# Patient Record
Sex: Female | Born: 1940 | Race: White | Hispanic: No | Marital: Married | Smoking: Current every day smoker
Health system: Southern US, Community
[De-identification: ages and names within clinical notes are randomized; demographics above are authoritative.]

## PROBLEM LIST (undated history)

## (undated) DIAGNOSIS — M549 Dorsalgia, unspecified: Secondary | ICD-10-CM

## (undated) DIAGNOSIS — F32A Depression, unspecified: Secondary | ICD-10-CM

## (undated) DIAGNOSIS — G8929 Other chronic pain: Secondary | ICD-10-CM

## (undated) DIAGNOSIS — R0602 Shortness of breath: Secondary | ICD-10-CM

## (undated) DIAGNOSIS — F329 Major depressive disorder, single episode, unspecified: Secondary | ICD-10-CM

## (undated) DIAGNOSIS — F419 Anxiety disorder, unspecified: Secondary | ICD-10-CM

## (undated) HISTORY — PX: APPENDECTOMY: SHX54

## (undated) HISTORY — PX: FINGER EXPLORATION: SHX1635

---

## 2009-12-11 ENCOUNTER — Ambulatory Visit: Payer: Self-pay | Admitting: Orthopedic Surgery

## 2009-12-11 DIAGNOSIS — M1710 Unilateral primary osteoarthritis, unspecified knee: Secondary | ICD-10-CM

## 2010-01-08 ENCOUNTER — Ambulatory Visit: Payer: Self-pay | Admitting: Orthopedic Surgery

## 2010-01-08 DIAGNOSIS — G589 Mononeuropathy, unspecified: Secondary | ICD-10-CM | POA: Insufficient documentation

## 2010-01-16 ENCOUNTER — Encounter: Payer: Self-pay | Admitting: Orthopedic Surgery

## 2010-05-29 NOTE — Assessment & Plan Note (Signed)
Summary: NEW PROB/LT ANKLE PAIN/?XRAY/SEC HORIZ/CAF   Vital Signs:  Patient profile:   70 year old female Height:      64 inches Weight:      133 pounds Pulse rate:   74 / minute Resp:     16 per minute  Vitals Entered By: Fuller Canada MD (January 08, 2010 1:40 PM)  Visit Type:  new problem Referring Provider:  Dr. Gerilyn Pilgrim Primary Provider:  Dr. Gerilyn Pilgrim  CC:  left ankle pain.  History of Present Illness: I saw Christina Barnett in the office today for a  visit.  She is a 70 years old woman with the complaint of:  left ankle pain.  No injury.  Xrays today.  Meds: Endocet 10/325, Neurotnin 300mg , Cymbalta 60mg , Xanax 1mg , Nortriptyline 50mg .  70 year old female with sharp throbbing constant pain in her LEFT ankle and foot which she rates an 8/10.  Pain came on suddenly associated with intermittent swelling but no injury.  Pain is worse at night and goes into the lateral portion of her foot.  Pain seems to be better after walking worse with sitting or laying down.    Allergies: 1)  ! Penicillin 2)  ! Sulfa  Past History:  Past Surgical History: rt foot right hand  Family History: Family History of Diabetes Family History Coronary Heart Disease female < 71 Hx, family, chronic respiratory condition Family History of Arthritis  Review of Systems Constitutional:  Denies weight loss, weight gain, fever, chills, and fatigue. Cardiovascular:  Denies chest pain, palpitations, fainting, and murmurs. Respiratory:  Denies short of breath, wheezing, couch, tightness, pain on inspiration, and snoring . Gastrointestinal:  Denies heartburn, nausea, vomiting, diarrhea, constipation, and blood in your stools. Genitourinary:  Denies frequency, urgency, difficulty urinating, painful urination, flank pain, and bleeding in urine. Neurologic:  Denies numbness, tingling, unsteady gait, dizziness, tremors, and seizure. Musculoskeletal:  Complains of joint pain, swelling, and  instability; denies stiffness, redness, heat, and muscle pain. Endocrine:  Denies excessive thirst, exessive urination, and heat or cold intolerance. Psychiatric:  Denies nervousness, depression, anxiety, and hallucinations. Skin:  Denies changes in the skin, poor healing, rash, itching, and redness. HEENT:  Denies blurred or double vision, eye pain, redness, and watering. Immunology:  Denies seasonal allergies, sinus problems, and allergic to bee stings. Hemoatologic:  Denies easy bleeding and brusing.  Physical Exam  Additional Exam:  Gen. exam normal cardiovascular exam normal limits exam normal skin exam normal neuro exam decreased sensation in the lateral portion of the foot straight leg raise positive psychiatric exam normal ambulation normal  With inspection normal.  Ankle range of motion normal.  Normal strength in the foot and ankle.  Ankle is stable.   Impression & Recommendations:  Problem # 1:  MONONEURITIS OF UNSPECIFIED SITE (ICD-355.9)  x-rays of the foot are normal.  I think her foot pain is coming from her back recommend increasing her Neurontin to 2 in the morning along with the other doses during the day  no f/u  Orders: Est. Patient Level III (96045)  Patient Instructions: 1)  Increase the neurontin to 2 in the morning  2)  I think its the nerve in your leg and ankle that is inflamed from your back problem

## 2010-05-29 NOTE — Letter (Signed)
Summary: History form  History form   Imported By: Jacklynn Ganong 01/16/2010 15:58:00  _____________________________________________________________________  External Attachment:    Type:   Image     Comment:   External Document

## 2010-05-29 NOTE — Assessment & Plan Note (Signed)
Summary: LT KNEE PAIN,SWELL'G/NEED XRAY/REF K.DOONQUAH/SEC HORIZ/CAF   Vital Signs:  Patient profile:   70 year old female Height:      65 inches Weight:      133 pounds Pulse rate:   64 / minute Resp:     16 per minute  Vitals Entered By: Fuller Canada MD (December 11, 2009 1:54 PM)  Visit Type:  new patient Referring Provider:  Dr. Gerilyn Pilgrim Primary Provider:  Dr. Gerilyn Pilgrim  CC:  left knee pain.  History of Present Illness: I saw Christina Barnett in the office today for an initial visit.  She is a 70 years old woman with the complaint of:  left knee pain.  Xrays today in our office.  Meds: Oxycodone 10/325, Xanax, Cymbalta, Neurontin, Nortriptyline.  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     Allergies (verified): 1)  ! Penicillin 2)  ! Sulfa  Past History:  Past Medical History: scoliosis back pain anxiety  Past Surgical History: rt foot left hand  Family History: Family History of Diabetes Family History Coronary Heart Disease female < 65 Hx, family, chronic respiratory condition  Social History: Patient is married.  retired smokes 1ppd cigs no alcohol coffee use daily 11th grade ed.  Review of Systems Constitutional:  Denies weight loss, weight gain, fever, chills, and fatigue. Cardiovascular:  Denies chest pain, palpitations, fainting, and murmurs. Respiratory:  Denies short of breath, wheezing, couch, tightness, pain on inspiration, and snoring . Gastrointestinal:  Complains of heartburn; denies nausea, vomiting, diarrhea, constipation, and blood in your stools. Genitourinary:  Denies frequency, urgency, difficulty urinating, painful urination, flank pain, and bleeding in urine. Neurologic:  Denies numbness, tingling, unsteady gait, dizziness, tremors,  and seizure. Musculoskeletal:  Complains of joint pain, swelling, instability, and stiffness; denies redness, heat, and muscle pain. Endocrine:  Denies excessive thirst, exessive urination, and heat or cold intolerance. Psychiatric:  Complains of nervousness and anxiety; denies depression and hallucinations. Skin:  Denies changes in the skin, poor healing, rash, itching, and redness. HEENT:  Denies blurred or double vision, eye pain, redness, and watering. Immunology:  Complains of seasonal allergies; denies sinus problems and allergic to bee stings. Hemoatologic:  Complains of easy bleeding and brusing.  Physical Exam  Additional Exam:  GEN: well developed, well nourished, normal grooming and hygiene, no deformity and normal body habitus.   CDV: pulses are normal, no edema, no erythema. no tenderness  Lymph: normal lymph nodes   Skin: no rashes, skin lesions or open sores   NEURO: normal coordination, reflexes, sensation.   Psyche: awake, alert and oriented. Mood normal   Gait: painful antalgic LEFT lower extremity gait  Lateral joint line tenderness small to medium effusion.  Range of motion 120.  Motor exam normal.  Knee stable in anterior plane and coronal plane.  RIGHT knee full range of motion no contracture subluxation atrophy or tremor.     Impression & Recommendations: This is a very difficult problem in a chronic pain patient who has a lumbar disc problem which is not been fully treated.  She would be a pain management nightmare for knee replacement I think she is a poor candidate.  I think she is a get her back fixed and then if she gets that under control with pain management assistance she potentially could have a knee  replacement but with a high risk for stiffness and contracture due to lack of ability to perform physical therapy which is a relative contraindication in and of itself.  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 You may experience increased pain at the injection site. Apply ice pack to the area for 20 minutes every 2 hours and take 2 xtra strength tylenol every 8 hours. This increased pain will usually resolve in 24 hours. The injection will take effect in 3-10 days.  2)  Come back for left ankle problem

## 2010-05-29 NOTE — Letter (Signed)
Summary: History form  History form   Imported By: Jacklynn Ganong 12/12/2009 16:50:05  _____________________________________________________________________  External Attachment:    Type:   Image     Comment:   External Document

## 2011-10-02 ENCOUNTER — Encounter (HOSPITAL_COMMUNITY): Payer: Self-pay | Admitting: *Deleted

## 2011-10-02 ENCOUNTER — Emergency Department (HOSPITAL_COMMUNITY)
Admission: EM | Admit: 2011-10-02 | Discharge: 2011-10-02 | Disposition: A | Discharge status: Home / Self Care | Payer: Medicare Other | Attending: Emergency Medicine | Admitting: Emergency Medicine

## 2011-10-02 DIAGNOSIS — M7021 Olecranon bursitis, right elbow: Secondary | ICD-10-CM

## 2011-10-02 DIAGNOSIS — F17200 Nicotine dependence, unspecified, uncomplicated: Secondary | ICD-10-CM | POA: Insufficient documentation

## 2011-10-02 DIAGNOSIS — M7020 Olecranon bursitis, unspecified elbow: Secondary | ICD-10-CM | POA: Insufficient documentation

## 2011-10-02 MED ORDER — LIDOCAINE HCL (PF) 1 % IJ SOLN
5.0000 mL | Freq: Once | INTRAMUSCULAR | Status: AC
  Administered 2011-10-02: 5 mL
  Filled 2011-10-02: qty 5

## 2011-10-02 MED ORDER — DEXAMETHASONE SODIUM PHOSPHATE 4 MG/ML IJ SOLN
8.0000 mg | Freq: Once | INTRAMUSCULAR | Status: AC
  Administered 2011-10-02: 8 mg via INTRAMUSCULAR
  Filled 2011-10-02: qty 2

## 2011-10-02 NOTE — ED Notes (Addendum)
Swelling of rt elbow  For 2 weeks..  Alert, Nad. No known injury

## 2011-10-02 NOTE — ED Provider Notes (Signed)
Medical screening examination/treatment/procedure(s) were conducted as a shared visit with non-physician practitioner(s) and myself.  I personally evaluated the patient during the encounter  Pt with large elbow bursitis, otherwise well appearing   Joya Gaskins, MD 10/02/11 2204

## 2011-10-02 NOTE — Discharge Instructions (Signed)
Please remove the bandage on June 6. See your MD or return if any problem or sign of infection.

## 2011-10-02 NOTE — ED Provider Notes (Signed)
History     CSN: [id number]  Arrival date & time 10/02/11  2017   First MD Initiated Contact with Patient 10/02/11 2029      No chief complaint on file.   (Consider location/radiation/quality/duration/timing/severity/associated sxs/prior treatment) HPI Comments: Patient states that over the past 2 weeks she has noticed increasing swelling of her right elbow. It is now red and causing increasing pain. She has not had a direct trauma. But states she rests her elbows on the table frequently to push herself up due to to arthritis problems and chronic pain of the back. She's not had any operations or procedures on the right elbow or arm. She has tried her pain medications which have helped temporarily with the pain but the swelling continues to get worse.  The history is provided by the patient.    History reviewed. No pertinent past medical history.  Past Surgical History  Procedure Date  . Appendectomy     No family history on file.  History  Substance Use Topics  . Smoking status: Current Everyday Smoker -- 0.5 packs/day  . Smokeless tobacco: Not on file  . Alcohol Use: No    OB History    Grav Para Term Preterm Abortions TAB SAB Ect Mult Living                  Review of Systems  Constitutional: Negative for activity change.       All ROS Neg except as noted in HPI  HENT: Negative for nosebleeds and neck pain.   Eyes: Negative for photophobia and discharge.  Respiratory: Negative for cough, shortness of breath and wheezing.   Cardiovascular: Negative for chest pain and palpitations.  Gastrointestinal: Negative for abdominal pain and blood in stool.  Genitourinary: Negative for dysuria, frequency and hematuria.  Musculoskeletal: Positive for myalgias, back pain and arthralgias.  Skin: Negative.   Neurological: Negative for dizziness, seizures and speech difficulty.  Psychiatric/Behavioral: Negative for hallucinations and confusion.    Allergies  Penicillins and  Sulfonamide derivatives  Home Medications   Current Outpatient Rx  Name Route Sig Dispense Refill  . GABAPENTIN 600 MG PO TABS Oral Take 600-1,200 mg by mouth 3 (three) times daily. Take two tablets in the morning, one at noon, and two tablets at bedtime as directed    . IBUPROFEN 200 MG PO TABS Oral Take 400 mg by mouth every 6 (six) hours as needed. For pain    . NORTRIPTYLINE HCL 50 MG PO CAPS Oral Take 50 mg by mouth at bedtime.    . OXYCODONE-ACETAMINOPHEN 10-325 MG PO TABS Oral Take 1 tablet by mouth 4 (four) times daily as needed. For pain    . TRAMADOL HCL 50 MG PO TABS Oral Take 50-100 mg by mouth every 6 (six) hours as needed. For pain      BP 143/81  Pulse 90  Temp(Src) 97.6 F (36.4 C) (Oral)  Resp 20  Ht 5\' 5"  (1.651 m)  Wt 139 lb (63.05 kg)  BMI 23.13 kg/m2  SpO2 94%  Physical Exam  Nursing note and vitals reviewed. Constitutional: She is oriented to person, place, and time. She appears well-developed and well-nourished.  Non-toxic appearance.  HENT:  Head: Normocephalic.  Right Ear: Tympanic membrane and external ear normal.  Left Ear: Tympanic membrane and external ear normal.  Eyes: EOM and lids are normal. Pupils are equal, round, and reactive to light.  Neck: Normal range of motion. Neck supple. Carotid bruit is not present.  Cardiovascular: Normal rate, regular rhythm, intact distal pulses and normal pulses.   Murmur heard. Pulmonary/Chest: Breath sounds normal. No respiratory distress.  Abdominal: Soft. Bowel sounds are normal. There is no tenderness. There is no guarding.  Musculoskeletal: She exhibits tenderness.       There is full range of motion of the right shoulder with mild crepitus. The right elbow is swollen with some increased redness slight increased warmth but not hot. There is no red streaking of the right arm. There is pain with palpation and there is pain with some range of motion of the right elbow. There is full range of motion of the wrist  and fingers on the right. The radial pulses are symmetrical sensory is intact. There are degenerative joint disease changes of the hands and wrist.  Lymphadenopathy:       Head (right side): No submandibular adenopathy present.       Head (left side): No submandibular adenopathy present.    She has no cervical adenopathy.  Neurological: She is alert and oriented to person, place, and time. She has normal strength. No cranial nerve deficit or sensory deficit.  Skin: Skin is warm and dry.  Psychiatric: She has a normal mood and affect. Her speech is normal.    ED Course  Procedures: ASPIRATION OF RIGHT ELBOW BURSA - the patient is identified by arm band. Permission for the procedure is given by the patient. A timeout. Was taken just prior to the procedure. The right elbow was painted with chlorhexidine. A wheal was raised with 1% plain lidocaine. Following this the elbow was again painted with chlorhexidine. Using an 18-gauge needle 8 cc of serous fluid was removed from the versa of the right elbow. Minimal bleeding appreciated. A sterile dressing was applied by me. The patient tolerated the procedure without problem or complication. The fluid from the bursa was sent to the lab for culture.   Labs Reviewed  BODY FLUID CULTURE   No results found.   1. Bursitis of elbow, right       MDM  I have reviewed nursing notes, vital signs, and all appropriate lab and imaging results for this patient. Patient presents to the emergency room with a large swollen bursa of the right elbow. This was aspirated and 8 cc of serous fluid was removed without problem. The patient was given an injection of dexamethasone IM. Patient is to follow up with her primary care physician or return to the emergency apartment if any changes, problems, or concerns.       Kathie Dike, [place] 10/02/11 2136

## 2011-10-02 NOTE — ED Notes (Signed)
Large swollen area about the size of a small hen egg on right elbow for over 2 weeks

## 2011-10-06 LAB — BODY FLUID CULTURE

## 2011-10-09 ENCOUNTER — Ambulatory Visit (INDEPENDENT_AMBULATORY_CARE_PROVIDER_SITE_OTHER): Payer: Medicare Other | Admitting: Orthopedic Surgery

## 2011-10-09 ENCOUNTER — Ambulatory Visit (INDEPENDENT_AMBULATORY_CARE_PROVIDER_SITE_OTHER): Payer: Medicare Other

## 2011-10-09 ENCOUNTER — Encounter: Payer: Self-pay | Admitting: Orthopedic Surgery

## 2011-10-09 VITALS — BP 112/70 | Ht 65.0 in | Wt 139.0 lb

## 2011-10-09 DIAGNOSIS — M25529 Pain in unspecified elbow: Secondary | ICD-10-CM

## 2011-10-09 DIAGNOSIS — M7020 Olecranon bursitis, unspecified elbow: Secondary | ICD-10-CM

## 2011-10-09 NOTE — Patient Instructions (Addendum)
Soak in epsom salt 3 x a day for 20 min   Finish antibiotic

## 2011-10-09 NOTE — Progress Notes (Signed)
Patient ID: Christina Barnett, female   DOB: Jun 17, 1940, 71 y.o.   MRN: [id number] Chief Complaint  Patient presents with  . Elbow Pain    right elbow pain and swelling x 3 weeks, gradual onset, no known injury, seen in ER 10/02/11    History  This 71 year old female presents with history of RIGHT elbow pain and swelling for the last 3 weeks. The symptoms came on gradually  She went to the emergency room she was treated there with an injection. Attempted aspiration culture of the fluid analysis and started on some antibiotics. She still complaining of 8/10. Throbbing pain and some tingling from her neck into her elbow. The elbow was read.  Review of systems she denies chills, fever, complains of fatigue, complaint of heartburn, constipation, nervousness anxiety, easy bleeding easy bruising excessive thirst. Denies adverse reaction. Frequency, shortness of breath, chest pain or blurred vision  History reviewed. No pertinent past medical history.  Past Surgical History  Procedure Date  . Appendectomy    BP 112/70  Ht 5\' 5"  (1.651 m)  Wt 139 lb (63.05 kg)  BMI 23.13 kg/m2 Small thin frame. Oriented x3. Mood and affect. Normal.  Gait, station normal.  RIGHT elbow swelling and tenderness with redness of the skin. No breaking the skin. Range of motion normal tenderness over the olecranon bursa, which is swollen. Elbow is stable. Strength is normal. Pulses are negative. Sensation is intact. There are no pathologic reflexes and coordination is normal.  X-rays show soft tissue swelling. No fracture.  Impression olecranon bursitis with secondary cellulitis.  Aspiration of only blood  Recommended patient is to continue antibiotics and soak the elbow 3 times a day with Epson salt come back in about 12 days  Procedure note  Aspiration LEFT elbow.  Preoperative diagnosis olecranon bursitis. Posterior diagnosis same.  Verbal consent was obtained. Timeout was completed.  The elbow was  aspirated and 2 cc of bloody fluid was aspirated.  Sterile dressing was applied.

## 2011-10-21 ENCOUNTER — Encounter: Payer: Self-pay | Admitting: Orthopedic Surgery

## 2011-10-21 ENCOUNTER — Ambulatory Visit (INDEPENDENT_AMBULATORY_CARE_PROVIDER_SITE_OTHER): Payer: Medicare Other | Admitting: Orthopedic Surgery

## 2011-10-21 VITALS — BP 120/90 | Ht 65.0 in | Wt 139.0 lb

## 2011-10-21 DIAGNOSIS — M7020 Olecranon bursitis, unspecified elbow: Secondary | ICD-10-CM

## 2011-10-21 NOTE — Patient Instructions (Addendum)
Stop any blood thinning medications 1 week before surgery  (ibuprofen, aspirin, warfarin, plavix)   You have been scheduled for surgery.  All surgeries carry some risk.  Remember you always have the option of continued nonsurgical treatment. However in this situation the risks vs. the benefits favor surgery as the best treatment option. The risks of the surgery includes the following but is not limited to bleeding, infection, pulmonary embolus, death from anesthesia, nerve injury vascular injury or need for further surgery, continued pain.  Specific to this procedure the following risks and complications are rare but possible Recurrence and infection and various wound issues   You will have a bursectomy (removal of bursa right elbow)

## 2011-10-21 NOTE — Progress Notes (Signed)
Patient ID: Christina Barnett, female   DOB: [birth year], 71 y.o.   MRN: [id number] Chief Complaint  Patient presents with  . Follow-up    recheck right elbow    BP 120/90  Ht 5\' 5"  (1.651 m)  Wt 139 lb (63.05 kg)  BMI 23.13 kg/m2  The RIGHT elbow is still swollen and tender and quite inflamed. She has not responded to aspiration. So we have advised her to have an excision of the RIGHT olecranon bursa  H/p to be done later and is encorporated by ref   Convert a preop visit

## 2011-10-22 ENCOUNTER — Encounter (HOSPITAL_COMMUNITY): Payer: Self-pay | Admitting: Pharmacy Technician

## 2011-10-28 NOTE — Patient Instructions (Addendum)
20 Shanaye Rief  10/28/2011   Your procedure is scheduled on:  11/04/2011  Report to [facility] at  615  AM.  Call this number if you have problems the morning of surgery: [phone]   Remember:   Do not eat food:After Midnight.  May have clear liquids:until Midnight .    Take these medicines the morning of surgery with A SIP OF WATER: xanax,cymbalta, neurontin,percocet,ultram   Do not wear jewelry, make-up or nail polish.  Do not wear lotions, powders, or perfumes. You may wear deodorant.  Do not shave 48 hours prior to surgery. Men may shave face and neck.  Do not bring valuables to the hospital.  Contacts, dentures or bridgework may not be worn into surgery.  Leave suitcase in the car. After surgery it may be brought to your room.  For patients admitted to the hospital, checkout time is 11:00 AM the day of discharge.   Patients discharged the day of surgery will not be allowed to drive home.  Name and phone number of your driver: family  Special Instructions: CHG Shower Use Special Wash: 1/2 bottle night before surgery and 1/2 bottle morning of surgery.   Please read over the following fact sheets that you were given: Pain Booklet, MRSA Information, Surgical Site Infection Prevention, Anesthesia Post-op Instructions and Care and Recovery After Surgery Olecranon Bursitis Bursitis is swelling and soreness (inflammation) of a fluid-filled sac (bursa) that covers and protects a joint. Olecranon bursitis occurs over the elbow.  CAUSES Bursitis can be caused by injury, overuse of the joint, arthritis, or infection.  SYMPTOMS   Tenderness, swelling, warmth, or redness over the elbow.   Elbow pain with movement. This is greater with bending the elbow.   Squeaking sound when the bursa is rubbed or moved.   Increasing size of the bursa without pain or discomfort.   Fever with increasing pain and swelling if the bursa becomes infected.  HOME CARE INSTRUCTIONS   Put ice on the  affected area.   Put ice in a plastic bag.   Place a towel between your skin and the bag.   Leave the ice on for 15 to 20 minutes each hour while awake. Do this for the first 2 days.   When resting, elevate your elbow above the level of your heart. This helps reduce swelling.   Continue to put the joint through a full range of motion 4 times per day. Rest the injured joint at other times. When the pain lessens, begin normal slow movements and usual activities.   Only take over-the-counter or prescription medicines for pain, discomfort, or fever as directed by your caregiver.   Reduce your intake of milk and related dairy products (cheese, yogurt). They may make your condition worse.  SEEK IMMEDIATE MEDICAL CARE IF:   Your pain increases even during treatment.   You have a fever.   You have heat and inflammation over the bursa and elbow.   You have a red line that goes up your arm.   You have pain with movement of your elbow.  MAKE SURE YOU:   Understand these instructions.   Will watch your condition.   Will get help right away if you are not doing well or get worse.  Document Released: 05/15/2006 Document Revised: 04/04/2011 Document Reviewed: 03/31/2007 [facility] Patient Information 2012 [place], [place].PATIENT INSTRUCTIONS POST-ANESTHESIA  IMMEDIATELY FOLLOWING SURGERY:  Do not drive or operate machinery for the first twenty four hours after surgery.  Do  not make any important decisions for twenty four hours after surgery or while taking narcotic pain medications or sedatives.  If you develop intractable nausea and vomiting or a severe headache please notify your doctor immediately.  FOLLOW-UP:  Please make an appointment with your surgeon as instructed. You do not need to follow up with anesthesia unless specifically instructed to do so.  WOUND CARE INSTRUCTIONS (if applicable):  Keep a dry clean dressing on the anesthesia/puncture wound site if there is drainage.  Once  the wound has quit draining you may leave it open to air.  Generally you should leave the bandage intact for twenty four hours unless there is drainage.  If the epidural site drains for more than 36-48 hours please call the anesthesia department.  QUESTIONS?:  Please feel free to call your physician or the hospital operator if you have any questions, and they will be happy to assist you.

## 2011-10-29 ENCOUNTER — Telehealth: Payer: Self-pay | Admitting: Orthopedic Surgery

## 2011-10-29 ENCOUNTER — Encounter (HOSPITAL_COMMUNITY): Payer: Self-pay

## 2011-10-29 ENCOUNTER — Encounter (HOSPITAL_COMMUNITY)
Admission: RE | Admit: 2011-10-29 | Discharge: 2011-10-29 | Disposition: A | Discharge status: Home / Self Care | Payer: Medicare Other | Source: Ambulatory Visit | Attending: Orthopedic Surgery | Admitting: Orthopedic Surgery

## 2011-10-29 HISTORY — DX: Depression, unspecified: F32.A

## 2011-10-29 HISTORY — DX: Other chronic pain: G89.29

## 2011-10-29 HISTORY — DX: Major depressive disorder, single episode, unspecified: F32.9

## 2011-10-29 HISTORY — DX: Dorsalgia, unspecified: M54.9

## 2011-10-29 HISTORY — DX: Anxiety disorder, unspecified: F41.9

## 2011-10-29 LAB — BASIC METABOLIC PANEL
CO2: 27 mEq/L (ref 19–32)
Chloride: 91 mEq/L — ABNORMAL LOW (ref 96–112)
Potassium: 5 mEq/L (ref 3.5–5.1)
Sodium: 128 mEq/L — ABNORMAL LOW (ref 135–145)

## 2011-10-29 LAB — CBC
Platelets: 332 10*3/uL (ref 150–400)
RBC: 4.33 MIL/uL (ref 3.87–5.11)
WBC: 8.4 10*3/uL (ref 4.0–10.5)

## 2011-10-29 LAB — SURGICAL PCR SCREEN: MRSA, PCR: NEGATIVE

## 2011-10-29 MED ORDER — CHLORHEXIDINE GLUCONATE 4 % EX LIQD
60.0000 mL | Freq: Once | CUTANEOUS | Status: DC
  Filled 2011-10-29: qty 60

## 2011-10-29 NOTE — Telephone Encounter (Signed)
Contacted insurer by phone, unable to get through, regarding pre-authorization, out-patient surgery scheduled 11/04/11 at [facility].  Submitted on-line pre-authorization notification, re: CPT [phone], ICD9 diagnosis 726.33. *  * Also confirmed by phone to [place] [employer], per [place] [facility], authorization received.   Approved, under notification ref# [id number] and per Jody C.

## 2011-11-02 NOTE — H&P (Signed)
  Chief Complaint   Patient presents with   .  Elbow Pain     right elbow pain and swelling x 3 weeks, gradual onset, no known injury, seen in ER 10/02/11    History  This 71 year old female presents with history of RIGHT elbow pain and swelling for the last 3 weeks. The symptoms came on gradually  She went to the emergency room she was treated there with an injection. Attempted aspiration culture of the fluid analysis and started on some antibiotics. She still complaining of 8/10. Throbbing pain and some tingling from her neck into her elbow. The elbow was red. I ASPIRATED BLOOD SHE DID NOT IMPROVE. WE DISCUSSED OPTIONS AND SHE WISHED TO HAVE MORE DEFINITIVE ACTION. SHE UNDERSTANDS THE RISK OF INFECTION AND WOUND PROBLEMS ASSOCIATED WITH THIS PROCEDURE.    Review of systems she denies chills, fever, complains of fatigue, complaint of heartburn, constipation, nervousness anxiety, easy bleeding easy bruising excessive thirst. Denies adverse reaction. Frequency, shortness of breath, chest pain or blurred vision  History reviewed. No pertinent past medical history.   Past Medical History  Diagnosis Date  . Chronic back pain   . Anxiety   . Depression     From back pain   Review of Systems  Constitutional: Negative.   HENT: Negative.   Eyes: Negative.   Cardiovascular: Negative.   Gastrointestinal: Negative.   Genitourinary: Negative.   Musculoskeletal: Positive for joint pain.  Skin: Negative for itching and rash.  Neurological: Negative.   Endo/Heme/Allergies: Negative.   Psychiatric/Behavioral: Negative.    History  Substance Use Topics  . Smoking status: Current Everyday Smoker -- 0.5 packs/day  . Smokeless tobacco: Not on file  . Alcohol Use: No   Family History  Problem Relation Age of Onset  . Arthritis      Past Surgical History   Procedure  Date   .  Appendectomy     BP 112/70  Ht 5\' 5"  (1.651 m)  Wt 139 lb (63.05 kg)  BMI 23.13 kg/m2  Small thin frame. Oriented  x3. Mood and affect. Normal.  Gait, station normal.   Vital signs are stable as recorded  General appearance is normal  The patient is alert and oriented x3  The patient's mood and affect are normal  Gait assessment: NORMAL  The cardiovascular exam reveals normal pulses and temperature without edema swelling.  The lymphatic system is negative for palpable lymph nodes  The sensory exam is normal.  There are no pathologic reflexes.  Balance is normal.  RIGHT elbow swelling and tenderness with redness of the skin. No breaking the skin. Range of motion normal tenderness over the olecranon bursa, which is swollen. Elbow is stable. Strength is normal. Pulses are negative. Sensation is intact. There are no pathologic reflexes and coordination is normal.   X-rays show soft tissue swelling. No fracture.    Impression olecranon bursitis with secondary cellulitis. REFRACTORY TO CONSERVATIVE MANAGEMENT  PLAN EXCISION RIGHT OLECRANON BURSA

## 2011-11-04 ENCOUNTER — Ambulatory Visit (HOSPITAL_COMMUNITY)
Admission: RE | Admit: 2011-11-04 | Discharge: 2011-11-04 | Disposition: A | Discharge status: Home / Self Care | Payer: Medicare Other | Source: Ambulatory Visit | Attending: Orthopedic Surgery | Admitting: Orthopedic Surgery

## 2011-11-04 ENCOUNTER — Encounter (HOSPITAL_COMMUNITY): Payer: Self-pay | Admitting: Anesthesiology

## 2011-11-04 ENCOUNTER — Ambulatory Visit (HOSPITAL_COMMUNITY): Payer: Medicare Other | Admitting: Anesthesiology

## 2011-11-04 ENCOUNTER — Encounter (HOSPITAL_COMMUNITY): Payer: Self-pay

## 2011-11-04 ENCOUNTER — Encounter (HOSPITAL_COMMUNITY)
Admission: RE | Disposition: A | Discharge status: Home / Self Care | Payer: Self-pay | Source: Ambulatory Visit | Attending: Orthopedic Surgery

## 2011-11-04 DIAGNOSIS — Z01812 Encounter for preprocedural laboratory examination: Secondary | ICD-10-CM | POA: Insufficient documentation

## 2011-11-04 DIAGNOSIS — Z01810 Encounter for preprocedural cardiovascular examination: Secondary | ICD-10-CM | POA: Insufficient documentation

## 2011-11-04 DIAGNOSIS — M7020 Olecranon bursitis, unspecified elbow: Secondary | ICD-10-CM | POA: Insufficient documentation

## 2011-11-04 HISTORY — DX: Shortness of breath: R06.02

## 2011-11-04 HISTORY — PX: OLECRANON BURSECTOMY: SHX2097

## 2011-11-04 SURGERY — BURSECTOMY, ELBOW
Anesthesia: General | Site: Elbow | Laterality: Right | Wound class: Clean

## 2011-11-04 MED ORDER — SODIUM CHLORIDE 0.9 % IR SOLN
Status: DC | PRN
  Administered 2011-11-04: 1000 mL

## 2011-11-04 MED ORDER — MIDAZOLAM HCL 2 MG/2ML IJ SOLN
1.0000 mg | INTRAMUSCULAR | Status: DC | PRN
  Administered 2011-11-04: 2 mg via INTRAVENOUS

## 2011-11-04 MED ORDER — ONDANSETRON HCL 4 MG/2ML IJ SOLN: 4.0000 mg | Freq: Once | INTRAMUSCULAR | Status: DC | PRN

## 2011-11-04 MED ORDER — MIDAZOLAM HCL 2 MG/2ML IJ SOLN
INTRAMUSCULAR | Status: AC
  Filled 2011-11-04: qty 2

## 2011-11-04 MED ORDER — MIDAZOLAM HCL 5 MG/5ML IJ SOLN
INTRAMUSCULAR | Status: DC | PRN
  Administered 2011-11-04: 2 mg via INTRAVENOUS

## 2011-11-04 MED ORDER — VANCOMYCIN HCL 1000 MG IV SOLR
1000.0000 mg | INTRAVENOUS | Status: DC | PRN
  Administered 2011-11-04: 1000 mg via INTRAVENOUS

## 2011-11-04 MED ORDER — VANCOMYCIN HCL IN DEXTROSE 1-5 GM/200ML-% IV SOLN
INTRAVENOUS | Status: AC
  Filled 2011-11-04: qty 200

## 2011-11-04 MED ORDER — VANCOMYCIN HCL IN DEXTROSE 1-5 GM/200ML-% IV SOLN: 1000.0000 mg | INTRAVENOUS | Status: DC

## 2011-11-04 MED ORDER — ONDANSETRON HCL 4 MG/2ML IJ SOLN
4.0000 mg | Freq: Once | INTRAMUSCULAR | Status: AC
  Administered 2011-11-04: 4 mg via INTRAVENOUS

## 2011-11-04 MED ORDER — PROPOFOL 10 MG/ML IV EMUL
INTRAVENOUS | Status: AC
  Filled 2011-11-04: qty 20

## 2011-11-04 MED ORDER — LIDOCAINE HCL (PF) 1 % IJ SOLN
INTRAMUSCULAR | Status: AC
  Filled 2011-11-04: qty 5

## 2011-11-04 MED ORDER — GLYCOPYRROLATE 0.2 MG/ML IJ SOLN
0.2000 mg | Freq: Once | INTRAMUSCULAR | Status: AC
  Administered 2011-11-04: 0.2 mg via INTRAVENOUS

## 2011-11-04 MED ORDER — ACETAMINOPHEN 500 MG PO TABS: 1000.0000 mg | ORAL_TABLET | Freq: Once | ORAL | Status: DC

## 2011-11-04 MED ORDER — BUPIVACAINE-EPINEPHRINE PF 0.5-1:200000 % IJ SOLN
INTRAMUSCULAR | Status: AC
  Filled 2011-11-04: qty 20

## 2011-11-04 MED ORDER — ONDANSETRON HCL 4 MG/2ML IJ SOLN
INTRAMUSCULAR | Status: AC
  Administered 2011-11-04: 4 mg via INTRAVENOUS
  Filled 2011-11-04: qty 2

## 2011-11-04 MED ORDER — LACTATED RINGERS IV SOLN
INTRAVENOUS | Status: DC
  Administered 2011-11-04: 08:00:00 via INTRAVENOUS

## 2011-11-04 MED ORDER — ACETAMINOPHEN 325 MG PO TABS
325.0000 mg | ORAL_TABLET | ORAL | Status: DC | PRN
Start: 2011-11-04 — End: 2011-11-04

## 2011-11-04 MED ORDER — MIDAZOLAM HCL 2 MG/2ML IJ SOLN
INTRAMUSCULAR | Status: AC
  Administered 2011-11-04: 2 mg via INTRAVENOUS
  Filled 2011-11-04: qty 2

## 2011-11-04 MED ORDER — LIDOCAINE HCL 1 % IJ SOLN
INTRAMUSCULAR | Status: DC | PRN
  Administered 2011-11-04: 50 mg via INTRADERMAL

## 2011-11-04 MED ORDER — FENTANYL CITRATE 0.05 MG/ML IJ SOLN: 25.0000 ug | INTRAMUSCULAR | Status: DC | PRN

## 2011-11-04 MED ORDER — GLYCOPYRROLATE 0.2 MG/ML IJ SOLN
INTRAMUSCULAR | Status: AC
  Administered 2011-11-04: 0.2 mg via INTRAVENOUS
  Filled 2011-11-04: qty 1

## 2011-11-04 MED ORDER — FENTANYL CITRATE 0.05 MG/ML IJ SOLN
INTRAMUSCULAR | Status: DC | PRN
  Administered 2011-11-04 (×2): 25 ug via INTRAVENOUS
  Administered 2011-11-04: 50 ug via INTRAVENOUS

## 2011-11-04 MED ORDER — PROPOFOL 10 MG/ML IV BOLUS
INTRAVENOUS | Status: DC | PRN
  Administered 2011-11-04: 150 mg via INTRAVENOUS

## 2011-11-04 MED ORDER — FENTANYL CITRATE 0.05 MG/ML IJ SOLN
INTRAMUSCULAR | Status: AC
  Filled 2011-11-04: qty 2

## 2011-11-04 MED ORDER — BUPIVACAINE-EPINEPHRINE 0.5% -1:200000 IJ SOLN
INTRAMUSCULAR | Status: DC | PRN
  Administered 2011-11-04: 20 mL

## 2011-11-04 SURGICAL SUPPLY — 51 items
BAG HAMPER (MISCELLANEOUS) ×2 IMPLANT
BANDAGE ELASTIC 4 VELCRO NS (GAUZE/BANDAGES/DRESSINGS) ×1 IMPLANT
BANDAGE ELASTIC 6 VELCRO NS (GAUZE/BANDAGES/DRESSINGS) IMPLANT
BANDAGE ESMARK 4X12 BL STRL LF (DISPOSABLE) ×1 IMPLANT
BANDAGE GAUZE ELAST BULKY 4 IN (GAUZE/BANDAGES/DRESSINGS) ×1 IMPLANT
BIT DRILL 2.0MX128MM (BIT) ×1 IMPLANT
BNDG CMPR 12X4 ELC STRL LF (DISPOSABLE) ×1
BNDG COHESIVE 4X5 TAN NS LF (GAUZE/BANDAGES/DRESSINGS) ×1 IMPLANT
BNDG ESMARK 4X12 BLUE STRL LF (DISPOSABLE) ×2
CHLORAPREP W/TINT 26ML (MISCELLANEOUS) ×2 IMPLANT
CLOTH BEACON ORANGE TIMEOUT ST (SAFETY) ×2 IMPLANT
COVER LIGHT HANDLE STERIS (MISCELLANEOUS) ×4 IMPLANT
CUFF TOURNIQUET SINGLE 18IN (TOURNIQUET CUFF) ×1 IMPLANT
DECANTER SPIKE VIAL GLASS SM (MISCELLANEOUS) ×2 IMPLANT
ELECT REM PT RETURN 9FT ADLT (ELECTROSURGICAL) ×2
ELECTRODE REM PT RTRN 9FT ADLT (ELECTROSURGICAL) ×1 IMPLANT
GAUZE KERLIX 2  STERILE LF (GAUZE/BANDAGES/DRESSINGS) ×1 IMPLANT
GAUZE XEROFORM 5X9 LF (GAUZE/BANDAGES/DRESSINGS) ×1 IMPLANT
GLOVE BIOGEL PI IND STRL 7.0 (GLOVE) IMPLANT
GLOVE BIOGEL PI INDICATOR 7.0 (GLOVE) ×3
GLOVE ECLIPSE 6.5 STRL STRAW (GLOVE) ×2 IMPLANT
GLOVE SKINSENSE NS SZ8.0 LF (GLOVE) ×1
GLOVE SKINSENSE STRL SZ8.0 LF (GLOVE) ×1 IMPLANT
GLOVE SS N UNI LF 8.5 STRL (GLOVE) ×2 IMPLANT
GOWN STRL REIN XL XLG (GOWN DISPOSABLE) ×7 IMPLANT
INST SET MINOR BONE (KITS) ×2 IMPLANT
KIT ROOM TURNOVER APOR (KITS) ×2 IMPLANT
MANIFOLD NEPTUNE II (INSTRUMENTS) ×2 IMPLANT
NDL HYPO 21X1.5 SAFETY (NEEDLE) ×1 IMPLANT
NEEDLE HYPO 21X1.5 SAFETY (NEEDLE) ×2 IMPLANT
NS IRRIG 1000ML POUR BTL (IV SOLUTION) ×2 IMPLANT
PACK BASIC LIMB (CUSTOM PROCEDURE TRAY) ×2 IMPLANT
PAD ABD 5X9 TENDERSORB (GAUZE/BANDAGES/DRESSINGS) ×1 IMPLANT
PAD ARMBOARD 7.5X6 YLW CONV (MISCELLANEOUS) ×2 IMPLANT
PAD CAST 4YDX4 CTTN HI CHSV (CAST SUPPLIES) ×1 IMPLANT
PADDING CAST COTTON 4X4 STRL (CAST SUPPLIES)
SET BASIN LINEN APH (SET/KITS/TRAYS/PACK) ×2 IMPLANT
SLING ARM FOAM STRAP LRG (SOFTGOODS) IMPLANT
SLING ARM FOAM STRAP MED (SOFTGOODS) IMPLANT
SLING ARM FOAM STRAP XLG (SOFTGOODS) IMPLANT
SPONGE GAUZE 4X4 12PLY (GAUZE/BANDAGES/DRESSINGS) ×1 IMPLANT
SPONGE LAP 18X18 X RAY DECT (DISPOSABLE) ×3 IMPLANT
STAPLER VISISTAT 35W (STAPLE) ×1 IMPLANT
SUT ETHILON 3 0 FSL (SUTURE) ×1 IMPLANT
SUT MON AB 2-0 SH 27 (SUTURE) ×2
SUT MON AB 2-0 SH27 (SUTURE) ×1 IMPLANT
SUT VIC AB 1 CT1 27 (SUTURE)
SUT VIC AB 1 CT1 27XBRD ANTBC (SUTURE) IMPLANT
SYR 30ML LL (SYRINGE) ×1 IMPLANT
SYR BULB IRRIGATION 50ML (SYRINGE) ×2 IMPLANT
SYR CONTROL 10ML LL (SYRINGE) ×2 IMPLANT

## 2011-11-04 NOTE — Interval H&P Note (Signed)
History and Physical Interval Note:  11/04/2011 7:45 AM  Christina Barnett  has presented today for surgery, with the diagnosis of bursa of right elbow  The various methods of treatment have been discussed with the patient and family. After consideration of risks, benefits and other options for treatment, the patient has consented to  Procedure(s) (LRB): OLECRANON BURSA (Right) excision as a surgical intervention .  The patient's history has been reviewed, patient examined, no change in status, stable for surgery.  I have reviewed the patients' chart and labs.  Questions were answered to the patient's satisfaction.     Fuller Canada

## 2011-11-04 NOTE — Progress Notes (Signed)
Pt's post op vitals showed an O2 sat of 91%. Pt is a 1pk/day smoker. Pt denies any dyspnea, cap refill < 3 sec. Pt's breathing is reported to be comfortable. Pt doesn't seem to be in any distress. Dr. Tollie Eth notified and he said he was ok with pt going home bc pt's O2 sat in pre-op was only 90%.

## 2011-11-04 NOTE — Transfer of Care (Signed)
Immediate Anesthesia Transfer of Care Note  Patient: Christina Barnett  Procedure(s) Performed: Procedure(s) (LRB): OLECRANON BURSA (Right)  Patient Location: PACU  Anesthesia Type: General  Level of Consciousness: awake and patient cooperative  Airway & Oxygen Therapy: Patient Spontanous Breathing and Patient connected to face mask oxygen  Post-op Assessment: Report given to PACU RN, Post -op Vital signs reviewed and stable and Patient moving all extremities  Post vital signs: Reviewed and stable  Complications: No apparent anesthesia complications

## 2011-11-04 NOTE — Anesthesia Postprocedure Evaluation (Signed)
  Anesthesia Post-op Note  Patient: Christina Barnett  Procedure(s) Performed: Procedure(s) (LRB): OLECRANON BURSA (Right)  Patient Location: PACU  Anesthesia Type: General  Level of Consciousness: awake, alert , oriented and patient cooperative  Airway and Oxygen Therapy: Patient Spontanous Breathing  Post-op Pain: none  Post-op Assessment: Post-op Vital signs reviewed, Patient's Cardiovascular Status Stable, Respiratory Function Stable, Patent Airway, No signs of Nausea or vomiting and Pain level controlled  Post-op Vital Signs: Reviewed and stable  Complications: No apparent anesthesia complications

## 2011-11-04 NOTE — Anesthesia Procedure Notes (Signed)
Procedure Name: LMA Insertion Date/Time: 11/04/2011 7:57 AM Performed by: Despina Hidden Pre-anesthesia Checklist: Patient identified, Emergency Drugs available, Suction available and Patient being monitored Patient Re-evaluated:Patient Re-evaluated prior to inductionOxygen Delivery Method: Circle system utilized Preoxygenation: Pre-oxygenation with 100% oxygen Intubation Type: IV induction Ventilation: Mask ventilation without difficulty LMA Size: 3.0 Number of attempts: 1 Placement Confirmation: breath sounds checked- equal and bilateral and positive ETCO2 Tube secured with: Tape Dental Injury: Teeth and Oropharynx as per pre-operative assessment

## 2011-11-04 NOTE — Brief Op Note (Addendum)
11/04/2011  8:36 AM  PATIENT:  Eber Jones Portilla  71 y.o. female  PRE-OPERATIVE DIAGNOSIS:  bursa of right elbow  POST-OPERATIVE DIAGNOSIS:  bursa of right elbow  PROCEDURE:  Procedure(s) (LRB): OLECRANON BURSA (Right)  SURGEON:  Surgeon(s) and Role:    * Vickki Hearing, MD - Primary  PHYSICIAN ASSISTANT:   ASSISTANTS: NICOLE SMALL ANESTHESIA:   general  EBL:  Total I/O In: 800 [I.V.:800] Out: -   BLOOD ADMINISTERED:0 CC PRBC  DRAINS: none   LOCAL MEDICATIONS USED:  MARCAINE    WITH EPI  SPECIMEN:  No Specimen  DISPOSITION OF SPECIMEN:  N/A  COUNTS:  YES  TOURNIQUET:   Total Tourniquet Time Documented: Upper Arm (Right) - 21 minutes  DICTATION: .Dragon Dictation  PLAN OF CARE: Discharge to home after PACU  PATIENT DISPOSITION:  PACU - hemodynamically stable.   Delay start of Pharmacological VTE agent (>24hrs) due to surgical blood loss or risk of bleeding: not applicable

## 2011-11-04 NOTE — Op Note (Signed)
11/04/2011  8:36 AM  PATIENT:  Christina Barnett  71 y.o. female  PRE-OPERATIVE DIAGNOSIS:  bursa of right elbow  POST-OPERATIVE DIAGNOSIS:  bursa of right elbow  PROCEDURE:  Procedure(s) (LRB): OLECRANON BURSA (Right)  Operative findings large fluid-filled olecranon bursa  SURGEON:  Surgeon(s) and Role:    * Vickki Hearing, MD - Primary  PHYSICIAN ASSISTANT:   ASSISTANTS: NICOLE SMALL ANESTHESIA:   general  EBL:  Total I/O In: 800 [I.V.:800] Out: -   BLOOD ADMINISTERED:0 CC PRBC  DRAINS: none   LOCAL MEDICATIONS USED:  MARCAINE    WITH EPI  SPECIMEN:  No Specimen  DISPOSITION OF SPECIMEN:  N/A  COUNTS:  YES  TOURNIQUET:   Total Tourniquet Time Documented: Upper Arm (Right) - 21 minutes  DICTATION: .Dragon Dictation  PLAN OF CARE: Discharge to home after PACU  PATIENT DISPOSITION:  PACU - hemodynamically stable.   Delay start of Pharmacological VTE agent (>24hrs) due to surgical blood loss or risk of bleeding: not applicable Details of procedure  The patient was evaluated in the preop area and the right elbow was marked as a surgical site confirmed with chart review and update.  The patient was taken to the operating room and given general anesthesia. In supine position right elbow was prepped and draped. The timeout procedure was completed.  A slightly off-center incision was made over the olecranon bursa blunt and sharp dissection was carried out until the plane between the bursa and the olecranon centered in the bursal sac was removed. The wound was irrigated and closed with 2-0 Monocryl and skin staples. We then injected 20 cc of Marcaine with epinephrine and applied a sterile dressing. The tourniquet was released. The patient was extubated and taken to the recovery room in stable condition

## 2011-11-04 NOTE — Anesthesia Preprocedure Evaluation (Signed)
Anesthesia Evaluation  Patient identified by MRN, date of birth, ID band Patient awake    Reviewed: Allergy & Precautions, H&P , NPO status , Patient's Chart, lab work & pertinent test results  Airway Mallampati: I TM Distance: >3 FB Neck ROM: Full    Dental  (+) Partial Upper   Pulmonary shortness of breath, Current Smoker,    Pulmonary exam normal       Cardiovascular negative cardio ROS  Rhythm:Regular Rate:Normal     Neuro/Psych PSYCHIATRIC DISORDERS Anxiety Depression  Neuromuscular disease    GI/Hepatic negative GI ROS, Neg liver ROS,   Endo/Other  negative endocrine ROS  Renal/GU negative Renal ROS     Musculoskeletal negative musculoskeletal ROS (+)   Abdominal Normal abdominal exam  (+)   Peds  Hematology negative hematology ROS (+)   Anesthesia Other Findings   Reproductive/Obstetrics negative OB ROS                           Anesthesia Physical Anesthesia Plan  ASA: III  Anesthesia Plan: General   Post-op Pain Management:    Induction: Intravenous  Airway Management Planned: LMA  Additional Equipment:   Intra-op Plan:   Post-operative Plan: Extubation in OR  Informed Consent: I have reviewed the patients History and Physical, chart, labs and discussed the procedure including the risks, benefits and alternatives for the proposed anesthesia with the patient or authorized representative who has indicated his/her understanding and acceptance.     Plan Discussed with: CRNA  Anesthesia Plan Comments:         Anesthesia Quick Evaluation

## 2011-11-06 ENCOUNTER — Ambulatory Visit (INDEPENDENT_AMBULATORY_CARE_PROVIDER_SITE_OTHER): Payer: Medicare Other | Admitting: Orthopedic Surgery

## 2011-11-06 ENCOUNTER — Encounter: Payer: Self-pay | Admitting: Orthopedic Surgery

## 2011-11-06 VITALS — BP 120/70 | Ht 65.0 in | Wt 139.0 lb

## 2011-11-06 DIAGNOSIS — M7020 Olecranon bursitis, unspecified elbow: Secondary | ICD-10-CM

## 2011-11-06 DIAGNOSIS — M7030 Other bursitis of elbow, unspecified elbow: Secondary | ICD-10-CM

## 2011-11-06 MED ORDER — DOXYCYCLINE HYCLATE 100 MG PO TABS: 100.0000 mg | ORAL_TABLET | Freq: Two times a day (BID) | ORAL | Status: AC

## 2011-11-06 NOTE — Progress Notes (Signed)
Patient ID: Christina Barnett, female   DOB: Nov 18, 1940, 71 y.o.   MRN: [id number] Chief Complaint  Patient presents with  . Routine Post Op    post op 1 elbow surgery DOS 11/04/11    BP 120/70  Ht 5\' 5"  (1.651 m)  Wt 139 lb (63.05 kg)  BMI 23.13 kg/m2  Postop olecranon bursectomy doing well his incision looks clean return on the 18th. Staples out  antibiotic continue

## 2011-11-06 NOTE — Patient Instructions (Addendum)
Pick up antibiotic from [facility]  Keep incision clean and dry

## 2011-11-07 ENCOUNTER — Encounter (HOSPITAL_COMMUNITY): Payer: Self-pay | Admitting: Orthopedic Surgery

## 2011-11-14 ENCOUNTER — Ambulatory Visit (INDEPENDENT_AMBULATORY_CARE_PROVIDER_SITE_OTHER): Payer: Medicare Other | Admitting: Orthopedic Surgery

## 2011-11-14 VITALS — Ht 65.0 in | Wt 139.0 lb

## 2011-11-14 DIAGNOSIS — M7020 Olecranon bursitis, unspecified elbow: Secondary | ICD-10-CM

## 2011-11-14 NOTE — Progress Notes (Signed)
Patient ID: Christina Barnett, female   DOB: 10-05-1940, 71 y.o.   MRN: [id number] Chief Complaint  Patient presents with  . Follow-up    Post op recheck on right elbow and staples out.    surgery 11/04/2011 bursectomy right elbow  Today for wound check and removal of staples. Wound clean staples out path report says bursitis  Continued tetracycline with a cellulitis related to the bursitis for an additional 14 days after the 20th  Next visit check wound and x-ray right knee

## 2011-11-14 NOTE — Patient Instructions (Addendum)
Continue antibiotics   Ok to shower   Remove strip in 1 week

## 2011-11-16 ENCOUNTER — Encounter: Payer: Self-pay | Admitting: Orthopedic Surgery

## 2011-12-12 ENCOUNTER — Encounter: Payer: Self-pay | Admitting: Orthopedic Surgery

## 2011-12-12 ENCOUNTER — Ambulatory Visit (INDEPENDENT_AMBULATORY_CARE_PROVIDER_SITE_OTHER): Payer: Medicare Other | Admitting: Orthopedic Surgery

## 2011-12-12 ENCOUNTER — Ambulatory Visit (INDEPENDENT_AMBULATORY_CARE_PROVIDER_SITE_OTHER): Payer: Medicare Other

## 2011-12-12 VITALS — BP 120/90 | Ht 65.0 in | Wt 139.0 lb

## 2011-12-12 DIAGNOSIS — M25569 Pain in unspecified knee: Secondary | ICD-10-CM

## 2011-12-12 DIAGNOSIS — M25561 Pain in right knee: Secondary | ICD-10-CM

## 2011-12-13 ENCOUNTER — Other Ambulatory Visit: Payer: Self-pay | Admitting: Neurology

## 2011-12-13 DIAGNOSIS — M545 Low back pain: Secondary | ICD-10-CM

## 2011-12-16 NOTE — Progress Notes (Signed)
Patient ID: Christina Barnett, female   DOB: 1940-05-05, 71 y.o.   MRN: [id number] Chief Complaint  Patient presents with  . Follow-up    4 week recheck wound, RIGHT elbow and eval right knee pain x one month, sudden onset, no known injury    Past Medical History  Diagnosis Date  . Chronic back pain   . Anxiety   . Depression     From back pain  . Shortness of breath   . Chronic back pain     Chronic back pain, right knee pain history of left knee pain  Right elbow a surgical site clean. No recurrence of bursal tissue Physical Exam  Constitutional: She is well-developed, well-nourished, and in no distress.  HENT:  Head: Normocephalic.  Eyes: Pupils are equal, round, and reactive to light.  Cardiovascular: Normal rate and intact distal pulses.   Pulmonary/Chest: Effort normal.  Abdominal: She exhibits no distension.  Neurological: She is alert. She has normal reflexes. No cranial nerve deficit. She exhibits normal muscle tone. Gait normal. Coordination normal.  Skin: Skin is warm and dry.  Psychiatric: Mood, memory, affect and judgment normal.   Right Knee Exam   Tenderness  The patient is experiencing tenderness in the lateral joint line.  Range of Motion  The patient has normal right knee ROM.  Muscle Strength   The patient has normal right knee strength.  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  n

## 2011-12-18 ENCOUNTER — Other Ambulatory Visit (HOSPITAL_COMMUNITY): Payer: Medicare Other

## 2011-12-23 ENCOUNTER — Ambulatory Visit (HOSPITAL_COMMUNITY)
Admission: RE | Admit: 2011-12-23 | Discharge: 2011-12-23 | Disposition: A | Discharge status: Home / Self Care | Payer: Medicare Other | Source: Ambulatory Visit | Attending: Neurology | Admitting: Neurology

## 2011-12-23 DIAGNOSIS — M545 Low back pain, unspecified: Secondary | ICD-10-CM | POA: Insufficient documentation

## 2011-12-23 DIAGNOSIS — M5126 Other intervertebral disc displacement, lumbar region: Secondary | ICD-10-CM | POA: Insufficient documentation

## 2011-12-23 DIAGNOSIS — M51379 Other intervertebral disc degeneration, lumbosacral region without mention of lumbar back pain or lower extremity pain: Secondary | ICD-10-CM | POA: Insufficient documentation

## 2011-12-23 DIAGNOSIS — M5137 Other intervertebral disc degeneration, lumbosacral region: Secondary | ICD-10-CM | POA: Insufficient documentation

## 2015-10-19 ENCOUNTER — Emergency Department (HOSPITAL_COMMUNITY)
Admission: EM | Admit: 2015-10-19 | Discharge: 2015-10-19 | Disposition: A | Discharge status: Home / Self Care | Payer: Medicare Other | Attending: Emergency Medicine | Admitting: Emergency Medicine

## 2015-10-19 ENCOUNTER — Emergency Department (HOSPITAL_COMMUNITY): Payer: Medicare Other

## 2015-10-19 ENCOUNTER — Encounter (HOSPITAL_COMMUNITY): Payer: Self-pay | Admitting: Emergency Medicine

## 2015-10-19 DIAGNOSIS — Z79899 Other long term (current) drug therapy: Secondary | ICD-10-CM | POA: Insufficient documentation

## 2015-10-19 DIAGNOSIS — F329 Major depressive disorder, single episode, unspecified: Secondary | ICD-10-CM | POA: Diagnosis not present

## 2015-10-19 DIAGNOSIS — Z791 Long term (current) use of non-steroidal anti-inflammatories (NSAID): Secondary | ICD-10-CM | POA: Diagnosis not present

## 2015-10-19 DIAGNOSIS — S81011A Laceration without foreign body, right knee, initial encounter: Secondary | ICD-10-CM | POA: Diagnosis not present

## 2015-10-19 DIAGNOSIS — Y939 Activity, unspecified: Secondary | ICD-10-CM | POA: Diagnosis not present

## 2015-10-19 DIAGNOSIS — Y999 Unspecified external cause status: Secondary | ICD-10-CM | POA: Diagnosis not present

## 2015-10-19 DIAGNOSIS — Y929 Unspecified place or not applicable: Secondary | ICD-10-CM | POA: Diagnosis not present

## 2015-10-19 DIAGNOSIS — S0990XA Unspecified injury of head, initial encounter: Secondary | ICD-10-CM | POA: Diagnosis not present

## 2015-10-19 DIAGNOSIS — S41112A Laceration without foreign body of left upper arm, initial encounter: Secondary | ICD-10-CM | POA: Diagnosis not present

## 2015-10-19 DIAGNOSIS — M545 Low back pain: Secondary | ICD-10-CM | POA: Diagnosis not present

## 2015-10-19 DIAGNOSIS — F17210 Nicotine dependence, cigarettes, uncomplicated: Secondary | ICD-10-CM | POA: Diagnosis not present

## 2015-10-19 DIAGNOSIS — S4992XA Unspecified injury of left shoulder and upper arm, initial encounter: Secondary | ICD-10-CM | POA: Diagnosis present

## 2015-10-19 DIAGNOSIS — S0083XA Contusion of other part of head, initial encounter: Secondary | ICD-10-CM | POA: Insufficient documentation

## 2015-10-19 DIAGNOSIS — R040 Epistaxis: Secondary | ICD-10-CM | POA: Diagnosis not present

## 2015-10-19 DIAGNOSIS — W19XXXA Unspecified fall, initial encounter: Secondary | ICD-10-CM

## 2015-10-19 DIAGNOSIS — W1839XA Other fall on same level, initial encounter: Secondary | ICD-10-CM | POA: Diagnosis not present

## 2015-10-19 DIAGNOSIS — T148XXA Other injury of unspecified body region, initial encounter: Secondary | ICD-10-CM

## 2015-10-19 MED ORDER — OXYCODONE-ACETAMINOPHEN 5-325 MG PO TABS
1.0000 | ORAL_TABLET | Freq: Once | ORAL | Status: AC
  Administered 2015-10-19: 1 via ORAL
  Filled 2015-10-19: qty 1

## 2015-10-19 MED ORDER — TETANUS-DIPHTH-ACELL PERTUSSIS 5-2.5-18.5 LF-MCG/0.5 IM SUSP
0.5000 mL | Freq: Once | INTRAMUSCULAR | Status: AC
  Administered 2015-10-19: 0.5 mL via INTRAMUSCULAR
  Filled 2015-10-19: qty 0.5

## 2015-10-19 NOTE — Discharge Instructions (Signed)
You do not have serious injury based on your x-ray and CT scans. Continue home medications, ice at rest. Return for worsening symptoms, including confusion, escalating pain, inability to walk or any other symptoms concerning to you.   Fall Prevention, Adult As a hospital patient, your condition and the treatments you receive can increase your risk for falls. Some additional risk factors for falls in a hospital include:  Being in an unfamiliar environment.  Being on bed rest.  Your surgery.  Taking certain medicines.  Your tubing requirements, such as intravenous (IV) therapy or catheters. It is important that you learn how to decrease fall risks while at the hospital. Below are important tips that can help prevent falls. SAFETY TIPS FOR PREVENTING FALLS Talk about your risk of falling.  Ask your health care provider why you are at risk for falling. Is it your medicine, illness, tubing placement, or something else?  Make a plan with your health care provider to keep you safe from falls.  Ask your health care provider or pharmacist about side effects of your medicines. Some medicines can make you dizzy or affect your coordination. Ask for help.  Ask for help before getting out of bed. You may need to press your call button.  Ask for assistance in getting safely to the toilet.  Ask for a walker or cane to be put at your bedside. Ask that most of the side rails on your bed be placed up before your health care provider leaves the room.  Ask family or friends to sit with you.  Ask for things that are out of your reach, such as your glasses, hearing aids, telephone, bedside table, or call button. Follow these tips to avoid falling:  Stay lying or seated, rather than standing, while waiting for help.  Wear rubber-soled slippers or shoes whenever you walk in the hospital.  Avoid quick, sudden movements.  Change positions slowly.  Sit on the side of your bed before  standing.  Stand up slowly and wait before you start to walk.  Let your health care provider know if there is a spill on the floor.  Pay careful attention to the medical equipment, electrical cords, and tubes around you.  When you need help, use your call button by your bed or in the bathroom. Wait for one of your health care providers to help you.  If you feel dizzy or unsure of your footing, return to bed and wait for assistance.  Avoid being distracted by the TV, telephone, or another person in your room.  Do not lean or support yourself on rolling objects, such as IV poles or bedside tables.   This information is not intended to replace advice given to you by your health care provider. Make sure you discuss any questions you have with your health care provider.   Document Released: 04/12/2000 Document Revised: 05/06/2014 Document Reviewed: 12/22/2011 Elsevier Interactive Patient Education [employer]2016 [employer]

## 2015-10-19 NOTE — ED Notes (Signed)
Pt ambulated well but with chronic back pain.

## 2015-10-19 NOTE — ED Notes (Signed)
Pt reports sitting on edge of bed when she woke up this morning. States when she stood up, her foot was asleep and she fell forward hitting her face on the carpeted floor. Pt c/o pain mostly to nose. Pt noted to have abrasions and bruising on face. Pt also has skin tears on LT upper arm. Pt denies LOC. Denies blood thinners.

## 2015-10-19 NOTE — ED Provider Notes (Signed)
CSN: [id number]     Arrival date & time 10/19/15  0865 History  By signing my name below, I, Christina Barnett, attest that this documentation has been prepared under the direction and in the presence of [name], MD.   Electronically Signed: Iona Barnett, ED Scribe. 10/19/2015. 11:38 AM   Chief Complaint  Patient presents with  . Fall   The history is provided by the patient. No language interpreter was used.   HPI Comments: Christina Barnett is a 75 y.o. female with PMHx of lower chronic back pain who presents to the Emergency Department complaining of sudden onset, multiple abrasions and ecchymoses to face s/p fall this morning when she was attempting to get out of bed and her right foot gave out because it had fallen asleep while she was sitting on the bed. Pt states she fell face first into her carpeted floor. She denies LOC in the incident and was ambulatory after falling. Pt reports associated epistaxis, left arm skin tears, right knee skin tear, myalgias, arthralgias, and lower back pain. No other associated symptoms noted. No worsening or alleviating factors noted. Pt denies neck pain, chest pain, abdominal pain, numbness, weakness, or any other pertinent symptoms. Pt is not currently on blood thinners. Pt is not UTD on tetanus.  Past Medical History  Diagnosis Date  . Chronic back pain   . Anxiety   . Depression     From back pain  . Shortness of breath   . Chronic back pain    Past Surgical History  Procedure Laterality Date  . Appendectomy    . Finger exploration      left index finger  . Olecranon bursectomy  11/04/2011    Procedure: OLECRANON BURSA;  Surgeon: Vickki Hearing, MD;  Location: [facility];  Service: Orthopedics;  Laterality: Right;  Excision of olecranon bursa   Family History  Problem Relation Age of Onset  . Arthritis     Social History  Substance Use Topics  . Smoking status: Current Every Day Smoker -- 0.50 packs/day for 50 years    Types:  Cigarettes  . Smokeless tobacco: None  . Alcohol Use: No   OB History    No data available     Review of Systems  HENT: Positive for nosebleeds.   Cardiovascular: Negative for chest pain.  Gastrointestinal: Negative for abdominal pain.  Musculoskeletal: Positive for myalgias, back pain and arthralgias. Negative for neck pain.  Skin: Positive for color change.       Multiple abrasions and ecchymoses to face. Skin tears noted to left arm. Right knee skin tear.  Neurological: Negative for weakness and numbness.  All other systems reviewed and are negative.    Allergies  Penicillins and Sulfonamide derivatives  Home Medications   Prior to Admission medications   Medication Sig Start Date End Date Taking? Authorizing Provider  ALPRAZolam Prudy Feeler) 1 MG tablet Take 1 mg by mouth 3 (three) times daily as needed. For anxiety   Yes Historical Provider, MD  gabapentin (NEURONTIN) 600 MG tablet Take 600-1,200 mg by mouth 3 (three) times daily. Take two tablets in the morning, one at noon, and two tablets at bedtime as directed   Yes Historical Provider, MD  ibuprofen (ADVIL,MOTRIN) 200 MG tablet Take 400 mg by mouth every 6 (six) hours as needed. For pain   Yes Historical Provider, MD  nortriptyline (PAMELOR) 50 MG capsule Take 50 mg by mouth at bedtime.   Yes Historical Provider, MD  oxyCODONE-acetaminophen (  PERCOCET) 10-325 MG per tablet Take 1 tablet by mouth 4 (four) times daily as needed. For pain   Yes Historical Provider, MD  polyvinyl alcohol (LIQUIFILM TEARS) 1.4 % ophthalmic solution Apply 2 drops to eye as needed. For dry eyes   Yes Historical Provider, MD  traMADol (ULTRAM) 50 MG tablet Take 50-100 mg by mouth every 6 (six) hours as needed. For pain   Yes Historical Provider, MD  DULoxetine (CYMBALTA) 60 MG capsule Take 60 mg by mouth daily.    Historical Provider, MD   BP 136/87 mmHg  Pulse 82  Temp(Src) 97.8 F (36.6 C) (Oral)  Resp 18  Wt 135 lb (61.236 kg)  SpO2  92% Physical Exam Physical Exam  Nursing note and vitals reviewed. Constitutional: Well developed, well nourished, non-toxic, and in no acute distress Head: Normocephalic and atraumatic, ecchymoses to bridge of nose without obvious deformity. Dried blood in right naris. Mouth/Throat: Oropharynx is clear and moist.  Neck: Normal range of motion. Neck supple.  Cardiovascular: Normal rate and regular rhythm.   Pulmonary/Chest: Effort normal and breath sounds normal.  Abdominal: Soft. There is no tenderness. There is no rebound and no guarding.  Musculoskeletal: Normal range of motion. Lumbar spine TTP without deformity or step off. Neurological: Alert, no facial droop, fluent speech, moves all extremities symmetrically Skin: Skin is warm and dry.  Psychiatric: Cooperative  ED Course  Procedures (including critical care time) DIAGNOSTIC STUDIES: Oxygen Saturation is 92% on RA, low by my interpretation.    COORDINATION OF CARE: 9:26 AM Discussed treatment plan which includes CT head without contrast, CT cervical spine without contrast, CT maxillofacial without CM, and DG lumbar spine complete with pt at bedside and pt agreed to plan.  Labs Review Labs Reviewed - No data to display  Imaging Review Dg Lumbar Spine Complete  10/19/2015  CLINICAL DATA:  Status post fall this morning. Low back pain. Initial encounter. EXAM: LUMBAR SPINE - COMPLETE 4+ VIEW COMPARISON:  MRI lumbar spine 12/23/2011. FINDINGS: Severe convex right scoliosis with the apex at L3 is identified. No fracture is seen. Severe multilevel loss of disc space height and advanced facet degenerative disease identified. Grade 1 anterolisthesis L2 on L3 and L3 on L4 due to facet arthropathy is unchanged. Straightening of the normal lumbar lordosis is identified. Extensive aortic atherosclerosis is noted. IMPRESSION: No acute abnormality. Marked convex right scoliosis and severe multilevel spondylosis. Atherosclerosis. Electronically  Signed   By: Drusilla Kannerhomas  Dalessio M.D.   On: 10/19/2015 10:19   Ct Head Wo Contrast  10/19/2015  CLINICAL DATA:  Fall with head injury. EXAM: CT HEAD WITHOUT CONTRAST CT MAXILLOFACIAL WITHOUT CONTRAST CT CERVICAL SPINE WITHOUT CONTRAST TECHNIQUE: Multidetector CT imaging of the head, cervical spine, and maxillofacial structures were performed using the standard protocol without intravenous contrast. Multiplanar CT image reconstructions of the cervical spine and maxillofacial structures were also generated. COMPARISON:  None. FINDINGS: CT HEAD FINDINGS Ventricle size normal.  Mild AV atrophy, consistent with age. Patchy hypodensity throughout the cerebral white matter most consistent with chronic microvascular ischemia. Chronic ischemia in the left thalamus and internal capsule bilaterally. Probable chronic ischemia right lateral pons No acute infarct. Negative for intracranial hemorrhage. Negative for mass lesion. Negative for skull fracture. Atherosclerotic disease. CT MAXILLOFACIAL FINDINGS Negative for fracture of the orbit or nasal bone. Negative for facial fracture. Negative for mandible fracture. Mild mucosal edema in the paranasal sinuses.  No air-fluid level. CT CERVICAL SPINE FINDINGS Mild anterior listhesis C3- 4 and C4-5 related  to facet degeneration. Disc degeneration and spondylosis and facet degeneration at C5-6 and C6-7. Bilateral facet degeneration C7-T1 with mild anterior listhesis. Negative for cervical spine fracture Carotid artery calcification bilaterally Advanced emphysema and apical scarring bilaterally. IMPRESSION: Atrophy and moderate to advanced chronic microvascular ischemia. No acute intracranial abnormality Negative for facial fracture Advanced cervical spine degenerative change. Negative for cervical spine fracture. Severe apical emphysema and scarring. Electronically Signed   By: Marlan Palau M.D.   On: 10/19/2015 11:18   Ct Cervical Spine Wo Contrast  10/19/2015  CLINICAL DATA:   Fall with head injury. EXAM: CT HEAD WITHOUT CONTRAST CT MAXILLOFACIAL WITHOUT CONTRAST CT CERVICAL SPINE WITHOUT CONTRAST TECHNIQUE: Multidetector CT imaging of the head, cervical spine, and maxillofacial structures were performed using the standard protocol without intravenous contrast. Multiplanar CT image reconstructions of the cervical spine and maxillofacial structures were also generated. COMPARISON:  None. FINDINGS: CT HEAD FINDINGS Ventricle size normal.  Mild AV atrophy, consistent with age. Patchy hypodensity throughout the cerebral white matter most consistent with chronic microvascular ischemia. Chronic ischemia in the left thalamus and internal capsule bilaterally. Probable chronic ischemia right lateral pons No acute infarct. Negative for intracranial hemorrhage. Negative for mass lesion. Negative for skull fracture. Atherosclerotic disease. CT MAXILLOFACIAL FINDINGS Negative for fracture of the orbit or nasal bone. Negative for facial fracture. Negative for mandible fracture. Mild mucosal edema in the paranasal sinuses.  No air-fluid level. CT CERVICAL SPINE FINDINGS Mild anterior listhesis C3- 4 and C4-5 related to facet degeneration. Disc degeneration and spondylosis and facet degeneration at C5-6 and C6-7. Bilateral facet degeneration C7-T1 with mild anterior listhesis. Negative for cervical spine fracture Carotid artery calcification bilaterally Advanced emphysema and apical scarring bilaterally. IMPRESSION: Atrophy and moderate to advanced chronic microvascular ischemia. No acute intracranial abnormality Negative for facial fracture Advanced cervical spine degenerative change. Negative for cervical spine fracture. Severe apical emphysema and scarring. Electronically Signed   By: Marlan Palau M.D.   On: 10/19/2015 11:18   Ct Maxillofacial Wo Cm  10/19/2015  CLINICAL DATA:  Fall with head injury. EXAM: CT HEAD WITHOUT CONTRAST CT MAXILLOFACIAL WITHOUT CONTRAST CT CERVICAL SPINE WITHOUT  CONTRAST TECHNIQUE: Multidetector CT imaging of the head, cervical spine, and maxillofacial structures were performed using the standard protocol without intravenous contrast. Multiplanar CT image reconstructions of the cervical spine and maxillofacial structures were also generated. COMPARISON:  None. FINDINGS: CT HEAD FINDINGS Ventricle size normal.  Mild AV atrophy, consistent with age. Patchy hypodensity throughout the cerebral white matter most consistent with chronic microvascular ischemia. Chronic ischemia in the left thalamus and internal capsule bilaterally. Probable chronic ischemia right lateral pons No acute infarct. Negative for intracranial hemorrhage. Negative for mass lesion. Negative for skull fracture. Atherosclerotic disease. CT MAXILLOFACIAL FINDINGS Negative for fracture of the orbit or nasal bone. Negative for facial fracture. Negative for mandible fracture. Mild mucosal edema in the paranasal sinuses.  No air-fluid level. CT CERVICAL SPINE FINDINGS Mild anterior listhesis C3- 4 and C4-5 related to facet degeneration. Disc degeneration and spondylosis and facet degeneration at C5-6 and C6-7. Bilateral facet degeneration C7-T1 with mild anterior listhesis. Negative for cervical spine fracture Carotid artery calcification bilaterally Advanced emphysema and apical scarring bilaterally. IMPRESSION: Atrophy and moderate to advanced chronic microvascular ischemia. No acute intracranial abnormality Negative for facial fracture Advanced cervical spine degenerative change. Negative for cervical spine fracture. Severe apical emphysema and scarring. Electronically Signed   By: Marlan Palau M.D.   On: 10/19/2015 11:18   I have personally reviewed  and evaluated these images as part of my medical decision-making.   EKG Interpretation None      MDM   Final diagnoses:  Fall, initial encounter  Skin abrasion   75 year old female who presents after mechanical fall. Well-appearing in no acute  distress. With skin abrasions over the left arm and right knee. She is neurologically intact. She does have evidence of some bruising over the bridge of the nose. CT head, cervical spine, and face are negative for any acute traumatic injuries. X-ray of her lumbar spine is negative as well. She is able to ambulate steadily. Appropriate for outpatient supportive care management. Strict return and follow-up instructions are reviewed. She expressed understanding of all discharge instructions, and felt comfortable with the plan of care..  I personally performed the services described in this documentation, which was scribed in my presence. The recorded information has been reviewed and is accurate.    Lavera Guiseana Duo Katherene Dinino, MD 10/19/15 1149

## 2016-06-17 ENCOUNTER — Other Ambulatory Visit (HOSPITAL_COMMUNITY): Payer: Self-pay | Admitting: Neurology

## 2016-06-17 DIAGNOSIS — R609 Edema, unspecified: Secondary | ICD-10-CM

## 2016-06-19 ENCOUNTER — Emergency Department (HOSPITAL_COMMUNITY)
Admission: EM | Admit: 2016-06-19 | Discharge: 2016-06-19 | Disposition: A | Discharge status: Home / Self Care | Payer: Medicare Other | Attending: Emergency Medicine | Admitting: Emergency Medicine

## 2016-06-19 ENCOUNTER — Ambulatory Visit (HOSPITAL_COMMUNITY)
Admission: RE | Admit: 2016-06-19 | Discharge: 2016-06-19 | Disposition: A | Discharge status: Home / Self Care | Payer: Medicare Other | Source: Ambulatory Visit | Attending: Neurology | Admitting: Neurology

## 2016-06-19 ENCOUNTER — Encounter (HOSPITAL_COMMUNITY): Payer: Self-pay | Admitting: Emergency Medicine

## 2016-06-19 DIAGNOSIS — I82432 Acute embolism and thrombosis of left popliteal vein: Secondary | ICD-10-CM | POA: Insufficient documentation

## 2016-06-19 DIAGNOSIS — I82412 Acute embolism and thrombosis of left femoral vein: Secondary | ICD-10-CM | POA: Insufficient documentation

## 2016-06-19 DIAGNOSIS — I824Z2 Acute embolism and thrombosis of unspecified deep veins of left distal lower extremity: Secondary | ICD-10-CM | POA: Insufficient documentation

## 2016-06-19 DIAGNOSIS — F17210 Nicotine dependence, cigarettes, uncomplicated: Secondary | ICD-10-CM | POA: Insufficient documentation

## 2016-06-19 DIAGNOSIS — Z79899 Other long term (current) drug therapy: Secondary | ICD-10-CM | POA: Diagnosis not present

## 2016-06-19 DIAGNOSIS — I82402 Acute embolism and thrombosis of unspecified deep veins of left lower extremity: Secondary | ICD-10-CM | POA: Diagnosis not present

## 2016-06-19 DIAGNOSIS — R609 Edema, unspecified: Secondary | ICD-10-CM | POA: Insufficient documentation

## 2016-06-19 DIAGNOSIS — M79605 Pain in left leg: Secondary | ICD-10-CM | POA: Diagnosis present

## 2016-06-19 LAB — CBC WITH DIFFERENTIAL/PLATELET
Basophils Absolute: 0 10*3/uL (ref 0.0–0.1)
Basophils Relative: 0 %
Eosinophils Absolute: 0.2 10*3/uL (ref 0.0–0.7)
Eosinophils Relative: 2 %
HEMATOCRIT: 37.1 % (ref 36.0–46.0)
Hemoglobin: 12.6 g/dL (ref 12.0–15.0)
LYMPHS PCT: 25 %
Lymphs Abs: 1.8 10*3/uL (ref 0.7–4.0)
MCH: 30.8 pg (ref 26.0–34.0)
MCHC: 34 g/dL (ref 30.0–36.0)
MCV: 90.7 fL (ref 78.0–100.0)
MONO ABS: 1.1 10*3/uL — AB (ref 0.1–1.0)
MONOS PCT: 15 %
NEUTROS ABS: 4.3 10*3/uL (ref 1.7–7.7)
Neutrophils Relative %: 58 %
Platelets: 380 10*3/uL (ref 150–400)
RBC: 4.09 MIL/uL (ref 3.87–5.11)
RDW: 12.2 % (ref 11.5–15.5)
WBC: 7.4 10*3/uL (ref 4.0–10.5)

## 2016-06-19 LAB — BASIC METABOLIC PANEL
ANION GAP: 8 (ref 5–15)
BUN: 9 mg/dL (ref 6–20)
CO2: 28 mmol/L (ref 22–32)
Calcium: 9.1 mg/dL (ref 8.9–10.3)
Chloride: 93 mmol/L — ABNORMAL LOW (ref 101–111)
Creatinine, Ser: 0.52 mg/dL (ref 0.44–1.00)
GFR calc Af Amer: >60 mL/min (ref >60)
GFR calc non Af Amer: >60 mL/min (ref >60)
Glucose, Bld: 110 mg/dL — ABNORMAL HIGH (ref 65–99)
POTASSIUM: 4 mmol/L (ref 3.5–5.1)
Sodium: 129 mmol/L — ABNORMAL LOW (ref 135–145)

## 2016-06-19 MED ORDER — RIVAROXABAN (XARELTO) VTE STARTER PACK (15 & 20 MG): ORAL_TABLET | ORAL | 0 refills | Status: AC

## 2016-06-19 NOTE — ED Provider Notes (Signed)
[facility] Provider Note   CSN: [id number] Arrival date & time: 06/19/16  1436     History   Chief Complaint Chief Complaint  Patient presents with  . DVT    HPI Christina Barnett is a 76 y.o. female.  Patient is a 76 year old female with history of chronic back pain. She presents for evaluation of left leg pain and swelling that is worsened for the past 10 days. This began in the absence of any injury or trauma. She denies any chest pain or shortness of breath. She denies any fevers or chills. She was seen by her primary Dr., then had an ultrasound obtained which revealed a DVT. She was then referred here for treatment.   The history is provided by the patient.    Past Medical History:  Diagnosis Date  . Anxiety   . Chronic back pain   . Chronic back pain   . Depression    From back pain  . Shortness of breath     Patient Active Problem List   Diagnosis Date Noted  . Olecranon bursitis 10/09/2011  . MONONEURITIS OF UNSPECIFIED SITE 01/08/2010  . DEGENERATIVE JOINT DISEASE, LEFT KNEE 12/11/2009    Past Surgical History:  Procedure Laterality Date  . APPENDECTOMY    . FINGER EXPLORATION     left index finger  . OLECRANON BURSECTOMY  11/04/2011   Procedure: OLECRANON BURSA;  Surgeon: Vickki Hearing, MD;  Location: [facility];  Service: Orthopedics;  Laterality: Right;  Excision of olecranon bursa    OB History    No data available       Home Medications    Prior to Admission medications   Medication Sig Start Date End Date Taking? Authorizing Provider  ALPRAZolam Prudy Feeler) 1 MG tablet Take 1 mg by mouth 3 (three) times daily as needed. For anxiety    Historical Provider, MD  DULoxetine (CYMBALTA) 60 MG capsule Take 60 mg by mouth daily.    Historical Provider, MD  gabapentin (NEURONTIN) 600 MG tablet Take 600-1,200 mg by mouth 3 (three) times daily. Take two tablets in the morning, one at noon, and two tablets at bedtime as directed    Historical  Provider, MD  ibuprofen (ADVIL,MOTRIN) 200 MG tablet Take 400 mg by mouth every 6 (six) hours as needed. For pain    Historical Provider, MD  nortriptyline (PAMELOR) 50 MG capsule Take 50 mg by mouth at bedtime.    Historical Provider, MD  oxyCODONE-acetaminophen (PERCOCET) 10-325 MG per tablet Take 1 tablet by mouth 4 (four) times daily as needed. For pain    Historical Provider, MD  polyvinyl alcohol (LIQUIFILM TEARS) 1.4 % ophthalmic solution Apply 2 drops to eye as needed. For dry eyes    Historical Provider, MD  traMADol (ULTRAM) 50 MG tablet Take 50-100 mg by mouth every 6 (six) hours as needed. For pain    Historical Provider, MD    Family History Family History  Problem Relation Age of Onset  . Arthritis      Social History Social History  Substance Use Topics  . Smoking status: Current Every Day Smoker    Packs/day: 0.50    Years: 50.00    Types: Cigarettes  . Smokeless tobacco: Never Used  . Alcohol use No     Allergies   Penicillins and Sulfonamide derivatives   Review of Systems Review of Systems  All other systems reviewed and are negative.    Physical Exam Updated Vital Signs BP 135/78 (BP  Location: Left Arm)   Pulse 88   Temp 97.7 F (36.5 C) (Oral)   Resp 20   Ht 5\' 7"  (1.702 m)   Wt 129 lb (58.5 kg)   SpO2 91%   BMI 20.20 kg/m   Physical Exam  Constitutional: She is oriented to person, place, and time. She appears well-developed and well-nourished. No distress.  HENT:  Head: Normocephalic and atraumatic.  Neck: Normal range of motion. Neck supple.  Cardiovascular: Normal rate and regular rhythm.  Exam reveals no gallop and no friction rub.   No murmur heard. Pulmonary/Chest: Effort normal and breath sounds normal. No respiratory distress. She has no wheezes.  Abdominal: Soft. Bowel sounds are normal. She exhibits no distension. There is no tenderness.  Musculoskeletal: Normal range of motion. She exhibits edema.  The left lower extremity is  significantly edematous. There is pain in the left calf. DP and PT pulses are palpable.  Neurological: She is alert and oriented to person, place, and time.  Skin: Skin is warm and dry. She is not diaphoretic.  Nursing note and vitals reviewed.    ED Treatments / Results  Labs (all labs ordered are listed, but only abnormal results are displayed) Labs Reviewed  CBC WITH DIFFERENTIAL/PLATELET - Abnormal; Notable for the following:       Result Value   Monocytes Absolute 1.1 (*)    All other components within normal limits  BASIC METABOLIC PANEL    EKG  EKG Interpretation None       Radiology US Venous Img Lower Unilateral Left  Result Date: 06/19/2016 CLINICAL DATA:  Left lower extremity swelling and pain above the foot for 2 weeks. These results are in process of being called to the ordering clinician or representative by the Radiology Department at the imaging location. The patient is being held for instructions. EXAM: LEFT LOWER EXTREMITY VENOUS DOPPLER ULTRASOUND TECHNIQUE: Gray-scale sonography with graded compression, as well as color Doppler and duplex ultrasound were performed to evaluate the lower extremity deep venous systems from the level of the common femoral vein and including the common femoral, femoral, profunda femoral, popliteal and calf veins including the posterior tibial, peroneal and gastrocnemius veins when visible. The superficial great saphenous vein was also interrogated. Spectral Doppler was utilized to evaluate flow at rest and with distal augmentation maneuvers in the common femoral, femoral and popliteal veins. COMPARISON:  None. FINDINGS: Contralateral Common Femoral Vein: Respiratory phasicity is normal and symmetric with the symptomatic side. No evidence of thrombus. Normal compressibility. Common Femoral Vein: No evidence of thrombus. Normal compressibility, respiratory phasicity and response to augmentation. Saphenofemoral Junction: Negative for thrombus  Profunda Femoral Vein: Negative for thrombus Femoral Vein: Diffuse occlusive thrombus with hypoechoic appearance and mildly expanded lumen consistent with recent thrombosis. Popliteal Vein: Occlusive thrombus Calf Veins: Occlusive thrombus seen in the upper and mid posterior tibial vein. The anterior tibial and peroneal veins are patent where seen. Superficial Great Saphenous Vein: No evidence of thrombus. Normal compressibility and flow on color Doppler imaging. IMPRESSION: Positive for large volume occlusive DVT in the left lower extremity. Clot extends from the posterior tibial vein to the distal femoral vein. Electronically Signed   By: Marnee Spring M.D.   On: 06/19/2016 14:07    Procedures Procedures (including critical care time)  Medications Ordered in ED Medications - No data to display   Initial Impression / Assessment and Plan / ED Course  I have reviewed the triage vital signs and the nursing notes.  Pertinent  labs & imaging results that were available during my care of the patient were reviewed by me and considered in my medical decision making (see chart for details).  Ultrasound shows large volume DVT in the popliteal extending from the popliteal into the femoral vein. I've discussed this finding with Dr. Myra GianottiBrabham from vascular surgery who is recommending Xarelto and follow-up in his office in the next 1-2 days.  Final Clinical Impressions(s) / ED Diagnoses   Final diagnoses:  None    New Prescriptions New Prescriptions   No medications on file     Geoffery Lyonsouglas Darryl Willner, MD 06/19/16 1530

## 2016-06-19 NOTE — ED Notes (Addendum)
Pt c/o pain under right breast while in room to d/c.  Pt states she fell at home a few days ago and husband put his arms around her to lift her up.  Pain is worse when palpating the area.  EDP notified.

## 2016-06-19 NOTE — ED Triage Notes (Signed)
Patient sent over by physician for ultrasound on left leg. Per ultrasound tech, results show "occlusive dvt" in left leg. Patient has significant swelling noted to left leg. Denies injury.

## 2016-06-19 NOTE — Discharge Instructions (Signed)
Xarelto as prescribed.  You are to follow-up with Dr. Myra GianottiBrabham in the next 1-2 days. His office will call you tomorrow to arrange a follow-up appointment.

## 2016-06-19 NOTE — ED Notes (Signed)
Assisted to restroom.

## 2016-06-20 ENCOUNTER — Encounter: Payer: Self-pay | Admitting: Vascular Surgery

## 2016-06-21 ENCOUNTER — Ambulatory Visit: Payer: Medicare Other | Admitting: Vascular Surgery

## 2016-06-24 ENCOUNTER — Encounter: Payer: Self-pay | Admitting: Surgery

## 2016-06-24 ENCOUNTER — Other Ambulatory Visit: Payer: Self-pay | Admitting: Surgery

## 2016-06-24 ENCOUNTER — Other Ambulatory Visit: Payer: Self-pay

## 2016-06-24 ENCOUNTER — Ambulatory Visit (INDEPENDENT_AMBULATORY_CARE_PROVIDER_SITE_OTHER)
Admission: RE | Admit: 2016-06-24 | Discharge: 2016-06-24 | Disposition: A | Discharge status: Home / Self Care | Payer: Medicare Other | Source: Ambulatory Visit | Attending: Surgery | Admitting: Surgery

## 2016-06-24 ENCOUNTER — Ambulatory Visit (INDEPENDENT_AMBULATORY_CARE_PROVIDER_SITE_OTHER): Payer: Medicare Other | Admitting: Surgery

## 2016-06-24 VITALS — BP 135/82 | HR 70 | Temp 97.2°F | Resp 20 | Ht 67.0 in | Wt 123.5 lb

## 2016-06-24 DIAGNOSIS — I82412 Acute embolism and thrombosis of left femoral vein: Secondary | ICD-10-CM

## 2016-06-24 DIAGNOSIS — I82432 Acute embolism and thrombosis of left popliteal vein: Secondary | ICD-10-CM | POA: Diagnosis not present

## 2016-06-24 NOTE — Progress Notes (Signed)
[facility]  Patient name: Christina Barnett MRN: [id number] DOB: 1940/08/29 Sex: female   REFERRING PROVIDER:    Dr. Judd Lien   REASON FOR CONSULT:    DVT  HISTORY OF PRESENT ILLNESS:   Christina Barnett is a 76 y.o. female, who is Referred today for evaluation of a left leg DVT.  She presented to the emergency department at [facility] on 06/19/2016 with left leg pain and swelling that had gotten worse over the past 10 days.  She denied any history of trauma.  She denied any prolonged levels of an activity or recent illness.  Ultrasound was positive for occlusive thrombus within the popliteal vein.  I discussed the case with the emergency department and the plan was to have the patient evaluated in the office the following day.  The patient reschedule that appointment until today.  The patient continues to complain of leg swelling.  This is worse when she stands up.  She does keep it elevated with some relief.  She is not wearing compression stockings.  She is taking Xaralto.  She does suffer from chronic back pain.  As a current smoker.  PAST MEDICAL HISTORY    Past Medical History:  Diagnosis Date  . Anxiety   . Chronic back pain   . Chronic back pain   . Depression    From back pain  . Shortness of breath      FAMILY HISTORY   Family History  Problem Relation Age of Onset  . Arthritis      SOCIAL HISTORY:   Social History   Social History  . Marital status: Married    Spouse name: N/A  . Number of children: N/A  . Years of education: N/A   Occupational History  . Not on file.   Social History Main Topics  . Smoking status: Current Every Day Smoker    Packs/day: 1.00    Years: 50.00    Types: Cigarettes  . Smokeless tobacco: Never Used  . Alcohol use No  . Drug use: No  . Sexual activity: Not on file   Other Topics Concern  . Not on file   Social History Narrative  . No  narrative on file    ALLERGIES:    Allergies  Allergen Reactions  . Penicillins Nausea And Vomiting    Childhood. Has patient had a PCN reaction causing immediate rash, facial/tongue/throat swelling, SOB or lightheadedness with hypotension: unknown Has patient had a PCN reaction causing severe rash involving mucus membranes or skin necrosis: unknown Has patient had a PCN reaction that required hospitalization: unknown Has patient had a PCN reaction occurring within the last 10 years: unknown If all of the above answers are "NO", then may proceed with Cephalosporin use.   . Sulfonamide Derivatives Nausea And Vomiting    CURRENT MEDICATIONS:    Current Outpatient Prescriptions  Medication Sig Dispense Refill  . ALPRAZolam (XANAX) 1 MG tablet Take 1 mg by mouth 3 (three) times daily as needed. For anxiety    . gabapentin (NEURONTIN) 600 MG tablet Take 600-1,200 mg by mouth 3 (three) times daily. Take two tablets in the morning, one at noon, and two tablets at bedtime as directed    . ibuprofen (ADVIL,MOTRIN) 200 MG tablet Take 400 mg by mouth every 6 (six) hours as needed. For pain    . nortriptyline (PAMELOR) 50 MG capsule Take 50 mg by mouth at bedtime.    Marland Kitchen oxyCODONE-acetaminophen (PERCOCET) 10-325  MG per tablet Take 1 tablet by mouth 4 (four) times daily as needed. For pain    . polyvinyl alcohol (LIQUIFILM TEARS) 1.4 % ophthalmic solution Apply 2 drops to eye as needed. For dry eyes    . Rivaroxaban 15 & 20 MG TBPK Take as directed on package: Start with one 15mg  tablet by mouth twice a day with food. On Day 22, switch to one 20mg  tablet once a day with food. 51 each 0  . traMADol (ULTRAM) 50 MG tablet Take 50-100 mg by mouth every 6 (six) hours as needed. For pain    . DULoxetine (CYMBALTA) 60 MG capsule Take 60 mg by mouth daily.     No current facility-administered medications for this visit.     REVIEW OF SYSTEMS:   [X]  denotes positive finding, [ ]  denotes negative  finding Cardiac  Comments:  Chest pain or chest pressure:    Shortness of breath upon exertion:    Short of breath when lying flat:    Irregular heart rhythm:        Vascular    Pain in calf, thigh, or hip brought on by ambulation:    Pain in feet at night that wakes you up from your sleep:     Blood clot in your veins:    Leg swelling:  x       Pulmonary    Oxygen at home:    Productive cough:     Wheezing:         Neurologic    Sudden weakness in arms or legs:     Sudden numbness in arms or legs:     Sudden onset of difficulty speaking or slurred speech:    Temporary loss of vision in one eye:     Problems with dizziness:         Gastrointestinal    Blood in stool:      Vomited blood:         Genitourinary    Burning when urinating:     Blood in urine:        Psychiatric    Major depression:         Hematologic    Bleeding problems:    Problems with blood clotting too easily:        Skin    Rashes or ulcers:        Constitutional    Fever or chills:     PHYSICAL EXAM:   Vitals:   06/24/16 1226  BP: 135/82  Pulse: 70  Resp: 20  Temp: 97.2 F (36.2 C)  TempSrc: Oral  SpO2: 96%  Weight: 123 lb 8 oz (56 kg)  Height: 5\' 7"  (1.702 m)    GENERAL: The patient is a well-nourished female, in no acute distress. The vital signs are documented above. CARDIAC: There is a regular rate and rhythm.  VASCULAR: Significant edema to the left leg when compared to the right. PULMONARY: Nonlabored respirations MUSCULOSKELETAL: There are no major deformities or cyanosis. NEUROLOGIC: No focal weakness or paresthesias are detected. SKIN: Several small open areas to the left leg. PSYCHIATRIC: The patient has a normal affect.  STUDIES:   I have reviewed the outside ultrasound shows diffuse occlusive thrombus in the femoral vein and popliteal vein as well as in the calf veins  Venous duplex: His repeated in our office today which shows propagation of the occlusive  thrombus component and her left leg which now goes up to the profunda origin.  ASSESSMENT and PLAN   Left leg DVT with femoral and popliteal occlusion.  I had an extensive discussion with the patient and her family regarding treatment options of anticoagulation versus an attempt at from a lysis.  Because of her level of discomfort and the degree of swelling in her leg, we have decided to proceed with thrombus lysis.  This will be done tomorrow, as her clot has been present for at least 2 weeks.  I have encouraged her to hydrate herself as much as possible.  She will be admitted after the procedure which will be on her left leg tomorrow.  We discussed the risks of the procedure including renal dysfunction, bleeding, and lack of clinical success.  We also discussed the possibility of keeping a catheter in overnight.   Durene Cal, MD [facility] [phone] Pager [phone]

## 2016-06-25 ENCOUNTER — Encounter (HOSPITAL_COMMUNITY): Admission: AD | Disposition: E | Payer: Self-pay | Source: Ambulatory Visit | Attending: Surgery

## 2016-06-25 ENCOUNTER — Encounter (HOSPITAL_COMMUNITY): Payer: Self-pay | Admitting: Surgery

## 2016-06-25 ENCOUNTER — Inpatient Hospital Stay (HOSPITAL_COMMUNITY): Payer: Medicare Other

## 2016-06-25 ENCOUNTER — Inpatient Hospital Stay (HOSPITAL_COMMUNITY)
Admission: AD | Admit: 2016-06-25 | Discharge: 2016-07-28 | DRG: 023 | Disposition: E | Payer: Medicare Other | Source: Ambulatory Visit | Attending: Surgery | Admitting: Surgery

## 2016-06-25 ENCOUNTER — Ambulatory Visit (HOSPITAL_COMMUNITY): Payer: Medicare Other

## 2016-06-25 DIAGNOSIS — E8770 Fluid overload, unspecified: Secondary | ICD-10-CM | POA: Diagnosis not present

## 2016-06-25 DIAGNOSIS — R791 Abnormal coagulation profile: Secondary | ICD-10-CM | POA: Diagnosis present

## 2016-06-25 DIAGNOSIS — G9340 Encephalopathy, unspecified: Secondary | ICD-10-CM | POA: Diagnosis present

## 2016-06-25 DIAGNOSIS — T8501XA Breakdown (mechanical) of ventricular intracranial (communicating) shunt, initial encounter: Secondary | ICD-10-CM | POA: Diagnosis not present

## 2016-06-25 DIAGNOSIS — G8929 Other chronic pain: Secondary | ICD-10-CM | POA: Diagnosis present

## 2016-06-25 DIAGNOSIS — Z66 Do not resuscitate: Secondary | ICD-10-CM | POA: Diagnosis not present

## 2016-06-25 DIAGNOSIS — Y848 Other medical procedures as the cause of abnormal reaction of the patient, or of later complication, without mention of misadventure at the time of the procedure: Secondary | ICD-10-CM | POA: Diagnosis not present

## 2016-06-25 DIAGNOSIS — E43 Unspecified severe protein-calorie malnutrition: Secondary | ICD-10-CM | POA: Diagnosis present

## 2016-06-25 DIAGNOSIS — I82412 Acute embolism and thrombosis of left femoral vein: Secondary | ICD-10-CM | POA: Diagnosis present

## 2016-06-25 DIAGNOSIS — I4891 Unspecified atrial fibrillation: Secondary | ICD-10-CM | POA: Diagnosis not present

## 2016-06-25 DIAGNOSIS — I82432 Acute embolism and thrombosis of left popliteal vein: Secondary | ICD-10-CM | POA: Diagnosis present

## 2016-06-25 DIAGNOSIS — G911 Obstructive hydrocephalus: Secondary | ICD-10-CM | POA: Diagnosis present

## 2016-06-25 DIAGNOSIS — Z515 Encounter for palliative care: Secondary | ICD-10-CM

## 2016-06-25 DIAGNOSIS — I619 Nontraumatic intracerebral hemorrhage, unspecified: Secondary | ICD-10-CM | POA: Diagnosis present

## 2016-06-25 DIAGNOSIS — E876 Hypokalemia: Secondary | ICD-10-CM | POA: Diagnosis not present

## 2016-06-25 DIAGNOSIS — J9601 Acute respiratory failure with hypoxia: Secondary | ICD-10-CM | POA: Diagnosis present

## 2016-06-25 DIAGNOSIS — I2699 Other pulmonary embolism without acute cor pulmonale: Secondary | ICD-10-CM | POA: Diagnosis present

## 2016-06-25 DIAGNOSIS — I82402 Acute embolism and thrombosis of unspecified deep veins of left lower extremity: Secondary | ICD-10-CM

## 2016-06-25 DIAGNOSIS — I612 Nontraumatic intracerebral hemorrhage in hemisphere, unspecified: Secondary | ICD-10-CM | POA: Diagnosis not present

## 2016-06-25 DIAGNOSIS — I82439 Acute embolism and thrombosis of unspecified popliteal vein: Secondary | ICD-10-CM | POA: Diagnosis present

## 2016-06-25 DIAGNOSIS — R739 Hyperglycemia, unspecified: Secondary | ICD-10-CM | POA: Diagnosis not present

## 2016-06-25 DIAGNOSIS — F17210 Nicotine dependence, cigarettes, uncomplicated: Secondary | ICD-10-CM | POA: Diagnosis present

## 2016-06-25 DIAGNOSIS — J449 Chronic obstructive pulmonary disease, unspecified: Secondary | ICD-10-CM | POA: Diagnosis present

## 2016-06-25 DIAGNOSIS — Z6820 Body mass index (BMI) 20.0-20.9, adult: Secondary | ICD-10-CM

## 2016-06-25 DIAGNOSIS — J9600 Acute respiratory failure, unspecified whether with hypoxia or hypercapnia: Secondary | ICD-10-CM | POA: Diagnosis not present

## 2016-06-25 DIAGNOSIS — T45615A Adverse effect of thrombolytic drugs, initial encounter: Secondary | ICD-10-CM | POA: Diagnosis present

## 2016-06-25 DIAGNOSIS — M549 Dorsalgia, unspecified: Secondary | ICD-10-CM | POA: Diagnosis present

## 2016-06-25 DIAGNOSIS — I610 Nontraumatic intracerebral hemorrhage in hemisphere, subcortical: Secondary | ICD-10-CM | POA: Diagnosis not present

## 2016-06-25 DIAGNOSIS — J9690 Respiratory failure, unspecified, unspecified whether with hypoxia or hypercapnia: Secondary | ICD-10-CM

## 2016-06-25 DIAGNOSIS — R410 Disorientation, unspecified: Secondary | ICD-10-CM

## 2016-06-25 DIAGNOSIS — R34 Anuria and oliguria: Secondary | ICD-10-CM | POA: Diagnosis present

## 2016-06-25 DIAGNOSIS — I10 Essential (primary) hypertension: Secondary | ICD-10-CM | POA: Diagnosis present

## 2016-06-25 DIAGNOSIS — I82431 Acute embolism and thrombosis of right popliteal vein: Secondary | ICD-10-CM | POA: Diagnosis not present

## 2016-06-25 DIAGNOSIS — R4020 Unspecified coma: Secondary | ICD-10-CM

## 2016-06-25 DIAGNOSIS — G919 Hydrocephalus, unspecified: Secondary | ICD-10-CM

## 2016-06-25 DIAGNOSIS — I615 Nontraumatic intracerebral hemorrhage, intraventricular: Secondary | ICD-10-CM | POA: Diagnosis present

## 2016-06-25 DIAGNOSIS — Z01818 Encounter for other preprocedural examination: Secondary | ICD-10-CM

## 2016-06-25 HISTORY — PX: CORONARY ULTRASOUND/IVUS: CATH118244

## 2016-06-25 HISTORY — PX: LOWER EXTREMITY VENOGRAPHY: CATH118253

## 2016-06-25 HISTORY — PX: PERIPHERAL VASCULAR THROMBECTOMY: CATH118306

## 2016-06-25 LAB — BLOOD GAS, ARTERIAL
ACID-BASE EXCESS: 2 mmol/L (ref 0.0–2.0)
Bicarbonate: 27.4 mmol/L (ref 20.0–28.0)
DRAWN BY: 270271
FIO2: 100
MECHVT: 550 mL
O2 SAT: 99.4 %
PATIENT TEMPERATURE: 98.6
PCO2 ART: 54.6 mmHg — AB (ref 32.0–48.0)
PEEP/CPAP: 5 cmH2O
PH ART: 7.322 — AB (ref 7.350–7.450)
RATE: 15 resp/min
pO2, Arterial: 397 mmHg — ABNORMAL HIGH (ref 83.0–108.0)

## 2016-06-25 LAB — CBC
HCT: 35.2 % — ABNORMAL LOW (ref 36.0–46.0)
HEMATOCRIT: 36.9 % (ref 36.0–46.0)
HEMOGLOBIN: 11.7 g/dL — AB (ref 12.0–15.0)
HEMOGLOBIN: 12.4 g/dL (ref 12.0–15.0)
MCH: 30.3 pg (ref 26.0–34.0)
MCH: 30.8 pg (ref 26.0–34.0)
MCHC: 33.2 g/dL (ref 30.0–36.0)
MCHC: 33.6 g/dL (ref 30.0–36.0)
MCV: 91.2 fL (ref 78.0–100.0)
MCV: 91.6 fL (ref 78.0–100.0)
PLATELETS: 308 10*3/uL (ref 150–400)
PLATELETS: 323 10*3/uL (ref 150–400)
RBC: 3.86 MIL/uL — AB (ref 3.87–5.11)
RBC: 4.03 MIL/uL (ref 3.87–5.11)
RDW: 12.8 % (ref 11.5–15.5)
RDW: 12.7 % (ref 11.5–15.5)
WBC: 9.3 10*3/uL (ref 4.0–10.5)
WBC: 7.6 10*3/uL (ref 4.0–10.5)

## 2016-06-25 LAB — BASIC METABOLIC PANEL
Anion gap: 9 (ref 5–15)
BUN: 6 mg/dL (ref 6–20)
CHLORIDE: 104 mmol/L (ref 101–111)
CO2: 23 mmol/L (ref 22–32)
CREATININE: 0.49 mg/dL (ref 0.44–1.00)
Calcium: 8.9 mg/dL (ref 8.9–10.3)
GFR calc Af Amer: >60 mL/min (ref >60)
GFR calc non Af Amer: >60 mL/min (ref >60)
Glucose, Bld: 115 mg/dL — ABNORMAL HIGH (ref 65–99)
Potassium: 4.1 mmol/L (ref 3.5–5.1)
SODIUM: 136 mmol/L (ref 135–145)

## 2016-06-25 LAB — TYPE AND SCREEN
ABO/RH(D): B POS
ANTIBODY SCREEN: NEGATIVE

## 2016-06-25 LAB — POCT I-STAT, CHEM 8
BUN: 5 mg/dL — AB (ref 6–20)
CALCIUM ION: 1.18 mmol/L (ref 1.15–1.40)
CHLORIDE: 96 mmol/L — AB (ref 101–111)
CREATININE: 0.6 mg/dL (ref 0.44–1.00)
Glucose, Bld: 102 mg/dL — ABNORMAL HIGH (ref 65–99)
HCT: 43 % (ref 36.0–46.0)
Hemoglobin: 14.6 g/dL (ref 12.0–15.0)
Potassium: 4.2 mmol/L (ref 3.5–5.1)
Sodium: 135 mmol/L (ref 135–145)
TCO2: 29 mmol/L (ref 0–100)

## 2016-06-25 LAB — POCT ACTIVATED CLOTTING TIME: Activated Clotting Time: 175 seconds

## 2016-06-25 LAB — MRSA PCR SCREENING: MRSA by PCR: POSITIVE — AB

## 2016-06-25 LAB — PROTIME-INR
INR: 1.3
INR: 1.19
PROTHROMBIN TIME: 16.3 s — AB (ref 11.4–15.2)
Prothrombin Time: 15.2 seconds (ref 11.4–15.2)

## 2016-06-25 LAB — APTT
APTT: 54 s — AB (ref 24–36)
aPTT: 33 seconds (ref 24–36)

## 2016-06-25 LAB — FIBRINOGEN
Fibrinogen: 438 mg/dL (ref 210–475)
Fibrinogen: 495 mg/dL — ABNORMAL HIGH (ref 210–475)

## 2016-06-25 LAB — HEPARIN LEVEL (UNFRACTIONATED): HEPARIN UNFRACTIONATED: 0.82 [IU]/mL — AB (ref 0.30–0.70)

## 2016-06-25 LAB — TRIGLYCERIDES: Triglycerides: 75 mg/dL (ref <150)

## 2016-06-25 LAB — ABO/RH: ABO/RH(D): B POS

## 2016-06-25 SURGERY — LOWER EXTREMITY VENOGRAPHY

## 2016-06-25 MED ORDER — ACETAMINOPHEN 325 MG PO TABS
325.0000 mg | ORAL_TABLET | ORAL | Status: DC | PRN
  Administered 2016-06-29 – 2016-06-30 (×4): 650 mg via ORAL
  Filled 2016-06-25 (×4): qty 2

## 2016-06-25 MED ORDER — FENTANYL CITRATE (PF) 100 MCG/2ML IJ SOLN
100.0000 ug | Freq: Once | INTRAMUSCULAR | Status: AC
  Administered 2016-06-25: 100 ug via INTRAVENOUS

## 2016-06-25 MED ORDER — PROTHROMBIN COMPLEX CONC HUMAN 500 UNITS IV KIT
2723.0000 [IU] | PACK | Status: AC
  Administered 2016-06-25: 2723 [IU] via INTRAVENOUS
  Filled 2016-06-25: qty 109

## 2016-06-25 MED ORDER — MIDAZOLAM HCL 2 MG/2ML IJ SOLN
INTRAMUSCULAR | Status: AC
  Filled 2016-06-25: qty 2

## 2016-06-25 MED ORDER — OXYCODONE-ACETAMINOPHEN 10-325 MG PO TABS: 1.0000 | ORAL_TABLET | ORAL | Status: DC | PRN

## 2016-06-25 MED ORDER — LABETALOL HCL 5 MG/ML IV SOLN: 10.0000 mg | INTRAVENOUS | Status: DC | PRN

## 2016-06-25 MED ORDER — NORTRIPTYLINE HCL 25 MG PO CAPS: 50.0000 mg | ORAL_CAPSULE | Freq: Every day | ORAL | Status: DC

## 2016-06-25 MED ORDER — POLYVINYL ALCOHOL 1.4 % OP SOLN
2.0000 [drp] | OPHTHALMIC | Status: DC | PRN
  Filled 2016-06-25: qty 15

## 2016-06-25 MED ORDER — GUAIFENESIN-DM 100-10 MG/5ML PO SYRP: 15.0000 mL | ORAL_SOLUTION | ORAL | Status: DC | PRN

## 2016-06-25 MED ORDER — SODIUM CHLORIDE 0.9 % IV SOLN
INTRAVENOUS | Status: DC
  Administered 2016-06-25 – 2016-06-30 (×8): via INTRAVENOUS

## 2016-06-25 MED ORDER — ONDANSETRON HCL 4 MG/2ML IJ SOLN: 4.0000 mg | Freq: Four times a day (QID) | INTRAMUSCULAR | Status: DC | PRN

## 2016-06-25 MED ORDER — OXYCODONE HCL 5 MG PO TABS: 5.0000 mg | ORAL_TABLET | ORAL | Status: DC | PRN

## 2016-06-25 MED ORDER — METOPROLOL TARTRATE 5 MG/5ML IV SOLN: 2.0000 mg | INTRAVENOUS | Status: DC | PRN

## 2016-06-25 MED ORDER — ALUM & MAG HYDROXIDE-SIMETH 200-200-20 MG/5ML PO SUSP: 15.0000 mL | ORAL | Status: DC | PRN

## 2016-06-25 MED ORDER — MIDAZOLAM HCL 2 MG/2ML IJ SOLN
INTRAMUSCULAR | Status: DC | PRN
  Administered 2016-06-25 (×2): 2 mg via INTRAVENOUS

## 2016-06-25 MED ORDER — PROTAMINE SULFATE 10 MG/ML IV SOLN
50.0000 mg | INTRAVENOUS | Status: AC
  Administered 2016-06-25: 50 mg via INTRAVENOUS
  Filled 2016-06-25: qty 5

## 2016-06-25 MED ORDER — CLEVIDIPINE BUTYRATE 0.5 MG/ML IV EMUL
0.0000 mg/h | INTRAVENOUS | Status: DC
  Administered 2016-06-25: 1 mg/h via INTRAVENOUS
  Administered 2016-06-26: 2 mg/h via INTRAVENOUS
  Administered 2016-06-28 (×2): 5 mg/h via INTRAVENOUS
  Administered 2016-06-29: 3 mg/h via INTRAVENOUS
  Administered 2016-06-29: 1 mg/h via INTRAVENOUS
  Filled 2016-06-25 (×6): qty 50

## 2016-06-25 MED ORDER — FENTANYL CITRATE (PF) 100 MCG/2ML IJ SOLN
INTRAMUSCULAR | Status: AC
  Filled 2016-06-25: qty 2

## 2016-06-25 MED ORDER — ALPRAZOLAM 0.5 MG PO TABS: 1.0000 mg | ORAL_TABLET | Freq: Three times a day (TID) | ORAL | Status: DC | PRN

## 2016-06-25 MED ORDER — GABAPENTIN 600 MG PO TABS: 600.0000 mg | ORAL_TABLET | Freq: Every day | ORAL | Status: DC

## 2016-06-25 MED ORDER — PHENOL 1.4 % MT LIQD: 1.0000 | OROMUCOSAL | Status: DC | PRN

## 2016-06-25 MED ORDER — SODIUM CHLORIDE 0.9 % IV SOLN
25.0000 ug/h | INTRAVENOUS | Status: DC
  Administered 2016-06-25 – 2016-06-27 (×3): 100 ug/h via INTRAVENOUS
  Administered 2016-06-29: 75 ug/h via INTRAVENOUS
  Filled 2016-06-25 (×4): qty 50

## 2016-06-25 MED ORDER — IBUPROFEN 200 MG PO TABS: 400.0000 mg | ORAL_TABLET | Freq: Four times a day (QID) | ORAL | Status: DC | PRN

## 2016-06-25 MED ORDER — LIDOCAINE HCL (PF) 1 % IJ SOLN
INTRAMUSCULAR | Status: AC
  Filled 2016-06-25: qty 30

## 2016-06-25 MED ORDER — FENTANYL CITRATE (PF) 100 MCG/2ML IJ SOLN
INTRAMUSCULAR | Status: DC | PRN
  Administered 2016-06-25 (×2): 50 ug via INTRAVENOUS

## 2016-06-25 MED ORDER — PROPOFOL 1000 MG/100ML IV EMUL
0.0000 ug/kg/min | INTRAVENOUS | Status: DC
  Administered 2016-06-25: 30 ug/kg/min via INTRAVENOUS
  Administered 2016-06-25 – 2016-06-26 (×2): 40 ug/kg/min via INTRAVENOUS
  Administered 2016-06-26: 30 ug/kg/min via INTRAVENOUS
  Administered 2016-06-27: 25 ug/kg/min via INTRAVENOUS
  Administered 2016-06-27 (×3): 50 ug/kg/min via INTRAVENOUS
  Administered 2016-06-28 – 2016-06-29 (×2): 25 ug/kg/min via INTRAVENOUS
  Administered 2016-06-29: 20 ug/kg/min via INTRAVENOUS
  Administered 2016-06-29: 35 ug/kg/min via INTRAVENOUS
  Administered 2016-06-30: 30 ug/kg/min via INTRAVENOUS
  Filled 2016-06-25 (×15): qty 100

## 2016-06-25 MED ORDER — PROPOFOL 1000 MG/100ML IV EMUL
INTRAVENOUS | Status: AC
  Administered 2016-06-25: 40 ug/kg/min via INTRAVENOUS
  Filled 2016-06-25: qty 100

## 2016-06-25 MED ORDER — LIDOCAINE HCL (PF) 1 % IJ SOLN
INTRAMUSCULAR | Status: DC | PRN
  Administered 2016-06-25: 2 mL

## 2016-06-25 MED ORDER — FENTANYL BOLUS VIA INFUSION
25.0000 ug | INTRAVENOUS | Status: DC | PRN
  Filled 2016-06-25: qty 25

## 2016-06-25 MED ORDER — IODIXANOL 320 MG/ML IV SOLN
INTRAVENOUS | Status: DC | PRN
  Administered 2016-06-25: 35 mL via INTRAVENOUS

## 2016-06-25 MED ORDER — OXYCODONE-ACETAMINOPHEN 5-325 MG PO TABS: 1.0000 | ORAL_TABLET | ORAL | Status: DC | PRN

## 2016-06-25 MED ORDER — ACETAMINOPHEN 650 MG RE SUPP: 325.0000 mg | RECTAL | Status: DC | PRN

## 2016-06-25 MED ORDER — HEPARIN SODIUM (PORCINE) 1000 UNIT/ML IJ SOLN
INTRAMUSCULAR | Status: DC | PRN
  Administered 2016-06-25: 6000 [IU] via INTRAVENOUS

## 2016-06-25 MED ORDER — GABAPENTIN 600 MG PO TABS
1200.0000 mg | ORAL_TABLET | Freq: Every morning | ORAL | Status: DC
  Filled 2016-06-25: qty 2

## 2016-06-25 MED ORDER — ETOMIDATE 2 MG/ML IV SOLN
0.3000 mg/kg | Freq: Once | INTRAVENOUS | Status: AC
  Administered 2016-06-25: 20 mg via INTRAVENOUS

## 2016-06-25 MED ORDER — IODIXANOL 320 MG/ML IV SOLN
INTRAVENOUS | Status: DC | PRN
  Administered 2016-06-25: 25 mL

## 2016-06-25 MED ORDER — HEPARIN SODIUM (PORCINE) 1000 UNIT/ML IJ SOLN
INTRAMUSCULAR | Status: AC
  Filled 2016-06-25: qty 1

## 2016-06-25 MED ORDER — TRAMADOL HCL 50 MG PO TABS: 50.0000 mg | ORAL_TABLET | Freq: Four times a day (QID) | ORAL | Status: DC | PRN

## 2016-06-25 MED ORDER — HEPARIN (PORCINE) IN NACL 2-0.9 UNIT/ML-% IJ SOLN
INTRAMUSCULAR | Status: AC
  Filled 2016-06-25: qty 500

## 2016-06-25 MED ORDER — HEPARIN (PORCINE) IN NACL 2-0.9 UNIT/ML-% IJ SOLN
INTRAMUSCULAR | Status: DC | PRN
Start: 2016-06-25 — End: 2016-06-25
  Administered 2016-06-25: 500 mL

## 2016-06-25 MED ORDER — SODIUM CHLORIDE 0.9 % IV SOLN
10.0000 mg | INTRAVENOUS | Status: DC
  Filled 2016-06-25: qty 10

## 2016-06-25 MED ORDER — NORTRIPTYLINE HCL 10 MG PO CAPS
10.0000 mg | ORAL_CAPSULE | Freq: Every day | ORAL | Status: DC
  Administered 2016-06-25: 10 mg via ORAL
  Filled 2016-06-25: qty 1

## 2016-06-25 MED ORDER — SODIUM CHLORIDE 0.9 % IV SOLN
INTRAVENOUS | Status: DC
  Administered 2016-06-25: 10:00:00 via INTRAVENOUS

## 2016-06-25 MED ORDER — DOCUSATE SODIUM 100 MG PO CAPS
100.0000 mg | ORAL_CAPSULE | Freq: Every day | ORAL | Status: DC
  Filled 2016-06-25: qty 1

## 2016-06-25 MED ORDER — DOCUSATE SODIUM 50 MG/5ML PO LIQD
100.0000 mg | Freq: Two times a day (BID) | ORAL | Status: DC | PRN
  Administered 2016-06-26 – 2016-06-30 (×3): 100 mg
  Filled 2016-06-25 (×3): qty 10

## 2016-06-25 MED ORDER — FAMOTIDINE IN NACL 20-0.9 MG/50ML-% IV SOLN
20.0000 mg | Freq: Two times a day (BID) | INTRAVENOUS | Status: DC
  Administered 2016-06-25 – 2016-06-30 (×10): 20 mg via INTRAVENOUS
  Filled 2016-06-25 (×10): qty 50

## 2016-06-25 MED ORDER — GABAPENTIN 600 MG PO TABS
1200.0000 mg | ORAL_TABLET | Freq: Every day | ORAL | Status: DC
  Administered 2016-06-25: 1200 mg via ORAL
  Filled 2016-06-25: qty 2

## 2016-06-25 MED ORDER — ALTEPLASE 100 MG IV SOLR
10.0000 mg | INTRAVENOUS | Status: AC
  Administered 2016-06-25: 13:00:00 via INTRAVENOUS
  Filled 2016-06-25: qty 10

## 2016-06-25 MED ORDER — MIDAZOLAM HCL 2 MG/2ML IJ SOLN
2.0000 mg | Freq: Once | INTRAMUSCULAR | Status: AC
  Administered 2016-06-25: 2 mg via INTRAVENOUS

## 2016-06-25 MED ORDER — FENTANYL CITRATE (PF) 100 MCG/2ML IJ SOLN: 50.0000 ug | Freq: Once | INTRAMUSCULAR | Status: DC

## 2016-06-25 MED ORDER — SODIUM CHLORIDE 0.9 % IV SOLN: 500.0000 mL | Freq: Once | INTRAVENOUS | Status: DC | PRN

## 2016-06-25 MED ORDER — HYDRALAZINE HCL 20 MG/ML IJ SOLN: 5.0000 mg | INTRAMUSCULAR | Status: DC | PRN

## 2016-06-25 MED ORDER — SODIUM CHLORIDE 0.9 % IV SOLN
Freq: Once | INTRAVENOUS | Status: AC
  Administered 2016-06-25: 16:00:00 via INTRAVENOUS

## 2016-06-25 SURGICAL SUPPLY — 20 items
BALLN MUSTANG 10.0X40 75 (BALLOONS) ×3
BALLN MUSTANG 10.0X40 75CM (BALLOONS) ×1
BALLOON MUSTANG 10.0X40 75 (BALLOONS) IMPLANT
CATH ANGIO 5F BER 100CM (CATHETERS) ×2 IMPLANT
CATH ANGIO 5F BER2 65CM (CATHETERS) ×2 IMPLANT
CATH TEMPO AQUA 5F 100CM (CATHETERS) ×2 IMPLANT
CATH VISIONS PV .035 IVUS (CATHETERS) ×2 IMPLANT
COVER PRB 48X5XTLSCP FOLD TPE (BAG) IMPLANT
COVER PROBE 5X48 (BAG) ×4
KIT HEART LEFT (KITS) ×4 IMPLANT
KIT PV (KITS) ×4 IMPLANT
PACK CARDIAC CATHETERIZATION (CUSTOM PROCEDURE TRAY) ×4 IMPLANT
SET ZELANTE DVT THROMB (CATHETERS) ×2 IMPLANT
SHEATH PINNACLE 9F 10CM (SHEATH) ×2 IMPLANT
SHIELD RADPAD SCOOP 12X17 (MISCELLANEOUS) ×2 IMPLANT
TRANSDUCER W/STOPCOCK (MISCELLANEOUS) ×4 IMPLANT
TRAY PV CATH (CUSTOM PROCEDURE TRAY) ×4 IMPLANT
WIRE AMPLATZ SSTIFF .035X260CM (WIRE) ×2 IMPLANT
WIRE BENTSON .035X145CM (WIRE) ×6 IMPLANT
WIRE MINI STICK MAX (SHEATH) ×2 IMPLANT

## 2016-06-25 NOTE — Consult Note (Signed)
CC:  Decreased mental status  HPI: Christina Barnett is a 76 y.o. female undergoing catheter thrombolysis of a left popliteal DVT. During the procedure she became less responsive. During the procedure she received 20mg  tPA, 6000mg  heparin, and was on Xarelto prior to the procedure. In the PACU she remained somewhat obtunded but was apparently able to follow commands, speaking in short sentences.  She was taken to CT which demonstrated large primarily intraventricular hemorrhage. She was transferred to the neuro-ICU.   PMH: Past Medical History:  Diagnosis Date  . Anxiety   . Chronic back pain   . Chronic back pain   . Depression    From back pain  . Shortness of breath     PSH: Past Surgical History:  Procedure Laterality Date  . APPENDECTOMY    . FINGER EXPLORATION     left index finger  . INTRAVASCULAR ULTRASOUND/IVUS N/A 06/14/2016   Procedure: Intravascular Ultrasound/IVUS;  Surgeon: Nada Libman, MD;  Location: [facility] INVASIVE CV LAB;  Service: Cardiovascular;  Laterality: N/A;  . LOWER EXTREMITY VENOGRAPHY Left 06/16/2016   Procedure: Lower Extremity Venography;  Surgeon: Nada Libman, MD;  Location: [facility] INVASIVE CV LAB;  Service: Cardiovascular;  Laterality: Left;  . OLECRANON BURSECTOMY  11/04/2011   Procedure: OLECRANON BURSA;  Surgeon: Vickki Hearing, MD;  Location: [facility] ORS;  Service: Orthopedics;  Laterality: Right;  Excision of olecranon bursa  . PERIPHERAL VASCULAR THROMBECTOMY N/A 06/24/2016   Procedure: Peripheral Vascular Thrombolysis;  Surgeon: Nada Libman, MD;  Location: [facility] INVASIVE CV LAB;  Service: Cardiovascular;  Laterality: N/A;    SH: Social History  Substance Use Topics  . Smoking status: Current Every Day Smoker    Packs/day: 1.00    Years: 50.00    Types: Cigarettes  . Smokeless tobacco: Never Used  . Alcohol use No    MEDS: Prior to Admission medications   Medication Sig Start Date End Date Taking? Authorizing Provider  ALPRAZolam Prudy Feeler) 1  MG tablet Take 1 mg by mouth 3 (three) times daily as needed. For anxiety   Yes Historical Provider, MD  gabapentin (NEURONTIN) 600 MG tablet Take 600-1,200 mg by mouth 3 (three) times daily. Take two tablets in the morning, one at noon, and two tablets at bedtime as directed   Yes Historical Provider, MD  nortriptyline (PAMELOR) 50 MG capsule Take 50 mg by mouth at bedtime.   Yes Historical Provider, MD  oxyCODONE-acetaminophen (PERCOCET) 10-325 MG per tablet Take 1 tablet by mouth 4 (four) times daily as needed. For pain   Yes Historical Provider, MD  Rivaroxaban 15 & 20 MG TBPK Take as directed on package: Start with one 15mg  tablet by mouth twice a day with food. On Day 22, switch to one 20mg  tablet once a day with food. 06/19/16  Yes Geoffery Lyons, MD  ibuprofen (ADVIL,MOTRIN) 200 MG tablet Take 400 mg by mouth every 6 (six) hours as needed. For pain    Historical Provider, MD  polyvinyl alcohol (LIQUIFILM TEARS) 1.4 % ophthalmic solution Apply 2 drops to eye as needed. For dry eyes    Historical Provider, MD  traMADol (ULTRAM) 50 MG tablet Take 50-100 mg by mouth every 6 (six) hours as needed. For pain    Historical Provider, MD    ALLERGY: Allergies  Allergen Reactions  . Penicillins Nausea And Vomiting    Childhood. Has patient had a PCN reaction causing immediate rash, facial/tongue/throat swelling, SOB or lightheadedness with hypotension: unknown Has patient had a  PCN reaction causing severe rash involving mucus membranes or skin necrosis: unknown Has patient had a PCN reaction that required hospitalization: unknown Has patient had a PCN reaction occurring within the last 10 years: unknown If all of the above answers are "NO", then may proceed with Cephalosporin use.   . Sulfonamide Derivatives Nausea And Vomiting    ROS: ROS  NEUROLOGIC EXAM: No eye opening to pain Right pupil 2-793mm, minimally reactive  Does groan to pain, not intelligible Localizes to pain L>R, not following  commands  IMGAING: CT demonstrates large primarily intraventricular hemorrhage involving the 3rd, 4th ventricles which are casted, and bilateral lateral ventricles. There is associated ventriculomegaly. There is also a small component of left frontal hemorrhage.  IMPRESSION: - 76 y.o. female s/p thrombolysis with tPA related IVH and hydrocephalus. She has had fairly rapid decline which I suspect is related to acute hydrocephalus.  PLAN: - Will give cyroprecipitate, K-centra, and protamine to reverse the tPA, Xarelto, and heparin. - Pt will be intubated by PCCM for airway protection - Will place EVD once the reversal agents are given.  I did review the situation with the patient's family. Need for ventriculostomy was discussed to treat her hydrocephalus. Risk of further bleeding was discussed. They agreed to proceed. All questions were answered.

## 2016-06-25 NOTE — H&P (View-Only) (Signed)
[facility] and [facility] Specialist of [facility]  Patient name: Christina Barnett MRN: [id number] DOB: 1940/08/29 Sex: female   REFERRING PROVIDER:    Dr. Judd Lien   REASON FOR CONSULT:    DVT  HISTORY OF PRESENT ILLNESS:   Christina Barnett is a 76 y.o. female, who is Referred today for evaluation of a left leg DVT.  She presented to the emergency department at [facility] on 06/19/2016 with left leg pain and swelling that had gotten worse over the past 10 days.  She denied any history of trauma.  She denied any prolonged levels of an activity or recent illness.  Ultrasound was positive for occlusive thrombus within the popliteal vein.  I discussed the case with the emergency department and the plan was to have the patient evaluated in the office the following day.  The patient reschedule that appointment until today.  The patient continues to complain of leg swelling.  This is worse when she stands up.  She does keep it elevated with some relief.  She is not wearing compression stockings.  She is taking Xaralto.  She does suffer from chronic back pain.  As a current smoker.  PAST MEDICAL HISTORY    Past Medical History:  Diagnosis Date  . Anxiety   . Chronic back pain   . Chronic back pain   . Depression    From back pain  . Shortness of breath      FAMILY HISTORY   Family History  Problem Relation Age of Onset  . Arthritis      SOCIAL HISTORY:   Social History   Social History  . Marital status: Married    Spouse name: N/A  . Number of children: N/A  . Years of education: N/A   Occupational History  . Not on file.   Social History Main Topics  . Smoking status: Current Every Day Smoker    Packs/day: 1.00    Years: 50.00    Types: Cigarettes  . Smokeless tobacco: Never Used  . Alcohol use No  . Drug use: No  . Sexual activity: Not on file   Other Topics Concern  . Not on file   Social History Narrative  . No  narrative on file    ALLERGIES:    Allergies  Allergen Reactions  . Penicillins Nausea And Vomiting    Childhood. Has patient had a PCN reaction causing immediate rash, facial/tongue/throat swelling, SOB or lightheadedness with hypotension: unknown Has patient had a PCN reaction causing severe rash involving mucus membranes or skin necrosis: unknown Has patient had a PCN reaction that required hospitalization: unknown Has patient had a PCN reaction occurring within the last 10 years: unknown If all of the above answers are "NO", then may proceed with Cephalosporin use.   . Sulfonamide Derivatives Nausea And Vomiting    CURRENT MEDICATIONS:    Current Outpatient Prescriptions  Medication Sig Dispense Refill  . ALPRAZolam (XANAX) 1 MG tablet Take 1 mg by mouth 3 (three) times daily as needed. For anxiety    . gabapentin (NEURONTIN) 600 MG tablet Take 600-1,200 mg by mouth 3 (three) times daily. Take two tablets in the morning, one at noon, and two tablets at bedtime as directed    . ibuprofen (ADVIL,MOTRIN) 200 MG tablet Take 400 mg by mouth every 6 (six) hours as needed. For pain    . nortriptyline (PAMELOR) 50 MG capsule Take 50 mg by mouth at bedtime.    Marland Kitchen oxyCODONE-acetaminophen (PERCOCET) 10-325  MG per tablet Take 1 tablet by mouth 4 (four) times daily as needed. For pain    . polyvinyl alcohol (LIQUIFILM TEARS) 1.4 % ophthalmic solution Apply 2 drops to eye as needed. For dry eyes    . Rivaroxaban 15 & 20 MG TBPK Take as directed on package: Start with one 15mg  tablet by mouth twice a day with food. On Day 22, switch to one 20mg  tablet once a day with food. 51 each 0  . traMADol (ULTRAM) 50 MG tablet Take 50-100 mg by mouth every 6 (six) hours as needed. For pain    . DULoxetine (CYMBALTA) 60 MG capsule Take 60 mg by mouth daily.     No current facility-administered medications for this visit.     REVIEW OF SYSTEMS:   [X]  denotes positive finding, [ ]  denotes negative  finding Cardiac  Comments:  Chest pain or chest pressure:    Shortness of breath upon exertion:    Short of breath when lying flat:    Irregular heart rhythm:        Vascular    Pain in calf, thigh, or hip brought on by ambulation:    Pain in feet at night that wakes you up from your sleep:     Blood clot in your veins:    Leg swelling:  x       Pulmonary    Oxygen at home:    Productive cough:     Wheezing:         Neurologic    Sudden weakness in arms or legs:     Sudden numbness in arms or legs:     Sudden onset of difficulty speaking or slurred speech:    Temporary loss of vision in one eye:     Problems with dizziness:         Gastrointestinal    Blood in stool:      Vomited blood:         Genitourinary    Burning when urinating:     Blood in urine:        Psychiatric    Major depression:         Hematologic    Bleeding problems:    Problems with blood clotting too easily:        Skin    Rashes or ulcers:        Constitutional    Fever or chills:     PHYSICAL EXAM:   Vitals:   06/24/16 1226  BP: 135/82  Pulse: 70  Resp: 20  Temp: 97.2 F (36.2 C)  TempSrc: Oral  SpO2: 96%  Weight: 123 lb 8 oz (56 kg)  Height: 5\' 7"  (1.702 m)    GENERAL: The patient is a well-nourished female, in no acute distress. The vital signs are documented above. CARDIAC: There is a regular rate and rhythm.  VASCULAR: Significant edema to the left leg when compared to the right. PULMONARY: Nonlabored respirations MUSCULOSKELETAL: There are no major deformities or cyanosis. NEUROLOGIC: No focal weakness or paresthesias are detected. SKIN: Several small open areas to the left leg. PSYCHIATRIC: The patient has a normal affect.  STUDIES:   I have reviewed the outside ultrasound shows diffuse occlusive thrombus in the femoral vein and popliteal vein as well as in the calf veins  Venous duplex: His repeated in our office today which shows propagation of the occlusive  thrombus component and her left leg which now goes up to the profunda origin.  ASSESSMENT and PLAN   Left leg DVT with femoral and popliteal occlusion.  I had an extensive discussion with the patient and her family regarding treatment options of anticoagulation versus an attempt at from a lysis.  Because of her level of discomfort and the degree of swelling in her leg, we have decided to proceed with thrombus lysis.  This will be done tomorrow, as her clot has been present for at least 2 weeks.  I have encouraged her to hydrate herself as much as possible.  She will be admitted after the procedure which will be on her left leg tomorrow.  We discussed the risks of the procedure including renal dysfunction, bleeding, and lack of clinical success.  We also discussed the possibility of keeping a catheter in overnight.   Durene Cal, MD [facility] [phone] Pager [phone]

## 2016-06-25 NOTE — Consult Note (Signed)
PULMONARY / CRITICAL CARE MEDICINE   Name: Christina Barnett MRN: [id number] DOB: 1940-12-18    ADMISSION DATE:  07-06-16 CONSULTATION DATE:  2016/07/06  REFERRING MD:  Dr. Myra Barnett  CHIEF COMPLAINT:  ICH  HISTORY OF PRESENT ILLNESS:   76 year old female with past mental history as below, which is relatively benign, but significant for DVT recently diagnosed on 2/21 at [facility]. He was noted to be an occlusive DVT. She was started on's are also and in conversation with vascular surgery it was determined she would be worked up as an outpatient the following day. Mrs. Christina Barnett then had a rescheduled for 2/26. Dr. Myra Barnett determined the left leg DVT was occluding the femoral and popliteal veins. After extensive discussion he agreed upon treatment was thrombolysis. She underwent this procedure 2/27, however, she unfortunate suffered a complication pulmonary and wasn't during the procedure. She was given full dose TPA for the probable pulmonary embolism. Unfortunately, as a result of TPA she suffered endocrine hemorrhage. She is quickly transferred to the ICU for further evaluation.  PAST MEDICAL HISTORY :  She  has a past medical history of Anxiety; Chronic back pain; Chronic back pain; Depression; and Shortness of breath.  PAST SURGICAL HISTORY: She  has a past surgical history that includes Appendectomy; Finger exploration; Olecranon bursectomy (11/04/2011); Lower Extremity Venography (Left, 07-06-16); Intravascular Ultrasound/IVUS (N/A, 07-06-2016); and Peripheral Vascular Thrombectomy (N/A, Jul 06, 2016).  Allergies  Allergen Reactions  . Penicillins Nausea And Vomiting    Childhood. Has patient had a PCN reaction causing immediate rash, facial/tongue/throat swelling, SOB or lightheadedness with hypotension: unknown Has patient had a PCN reaction causing severe rash involving mucus membranes or skin necrosis: unknown Has patient had a PCN reaction that required hospitalization:  unknown Has patient had a PCN reaction occurring within the last 10 years: unknown If all of the above answers are "NO", then may proceed with Cephalosporin use.   . Sulfonamide Derivatives Nausea And Vomiting    No current facility-administered medications on file prior to encounter.    Current Outpatient Prescriptions on File Prior to Encounter  Medication Sig  . ALPRAZolam (XANAX) 1 MG tablet Take 1 mg by mouth 3 (three) times daily as needed. For anxiety  . gabapentin (NEURONTIN) 600 MG tablet Take 600-1,200 mg by mouth 3 (three) times daily. Take two tablets in the morning, one at noon, and two tablets at bedtime as directed  . nortriptyline (PAMELOR) 50 MG capsule Take 50 mg by mouth at bedtime.  Marland Kitchen oxyCODONE-acetaminophen (PERCOCET) 10-325 MG per tablet Take 1 tablet by mouth 4 (four) times daily as needed. For pain  . Rivaroxaban 15 & 20 MG TBPK Take as directed on package: Start with one 15mg  tablet by mouth twice a day with food. On Day 22, switch to one 20mg  tablet once a day with food.  Marland Kitchen ibuprofen (ADVIL,MOTRIN) 200 MG tablet Take 400 mg by mouth every 6 (six) hours as needed. For pain  . polyvinyl alcohol (LIQUIFILM TEARS) 1.4 % ophthalmic solution Apply 2 drops to eye as needed. For dry eyes  . traMADol (ULTRAM) 50 MG tablet Take 50-100 mg by mouth every 6 (six) hours as needed. For pain    FAMILY HISTORY:  Her @FAMSTP (<SUBSCRIPT> error)@  SOCIAL HISTORY: She  reports that she has been smoking Cigarettes.  She has a 50.00 pack-year smoking history. She has never used smokeless tobacco. She reports that she does not drink alcohol or use drugs.  REVIEW OF SYSTEMS:   Unable as patient  is encephalopathic and intubated  SUBJECTIVE:    VITAL SIGNS: BP (!) 170/99   Pulse 72   Temp 97.6 F (36.4 C) (Oral)   Resp 18   Ht 5\' 5"  (1.651 m)   Wt 55.8 kg (123 lb)   SpO2 96%   BMI 20.47 kg/m   HEMODYNAMICS:    VENTILATOR SETTINGS:    INTAKE / OUTPUT: No  intake/output data recorded.  PHYSICAL EXAMINATION: General:  Thin elderly female on vent Neuro:  Sedated on vent HEENT:  [place]/AT. Pupils 3mm and unreactive Cardiovascular:  RRR, no MRG Lungs:  Clear bilateral breath sounds Abdomen:  Soft, non-distended Musculoskeletal:  No acute deformity Skin:  Grossly intact  LABS:  BMET  Recent Labs Lab 06/19/16 1502 2016-07-02 0959  NA 129* 135  K 4.0 4.2  CL 93* 96*  CO2 28  --   BUN 9 5*  CREATININE 0.52 0.60  GLUCOSE 110* 102*    Electrolytes  Recent Labs Lab 06/19/16 1502  CALCIUM 9.1    CBC  Recent Labs Lab 06/19/16 1502 2016/07/02 0959  WBC 7.4  --   HGB 12.6 14.6  HCT 37.1 43.0  PLT 380  --     Coag's No results for input(s): APTT, INR in the last 168 hours.  Sepsis Markers No results for input(s): LATICACIDVEN, PROCALCITON, O2SATVEN in the last 168 hours.  ABG No results for input(s): PHART, PCO2ART, PO2ART in the last 168 hours.  Liver Enzymes No results for input(s): AST, ALT, ALKPHOS, BILITOT, ALBUMIN in the last 168 hours.  Cardiac Enzymes No results for input(s): TROPONINI, PROBNP in the last 168 hours.  Glucose No results for input(s): GLUCAP in the last 168 hours.  Imaging Ct Head Wo Contrast  Result Date: 07/02/2016 CLINICAL DATA:  Confusion status post thrombolysis and mechanical thrombectomy of lower extremity DVT earlier today. EXAM: CT HEAD WITHOUT CONTRAST TECHNIQUE: Contiguous axial images were obtained from the base of the skull through the vertex without intravenous contrast. COMPARISON:  10/19/2015. FINDINGS: Brain: Marked acute hydrocephalus, and extensive intraventricular hemorrhage, likely originating from spontaneous or hypertensive LEFT thalamus hemorrhage, with third ventricular extension. Because the hemorrhage in the LEFT thalamus is immediately adjacent to third ventricle, it is difficult to determine the exact size, but estimated to be at least 1 cm. Also, in the LEFT frontal  region, there is a 2.6 x 2.1 x 2.2 cm acute hemorrhage, some layering serum, volume 6 mL, does not appear to connect with the dilated LEFT frontal horn. Some subarachnoid blood is seen to exit the BILATERAL foramina of [place]. Vascular: Blood pool effect within the cerebral vessels due to recent catheterization. Age related calcification of the distal vertebral arteries and cavernous carotid arteries. Skull: Normal. Negative for fracture or focal lesion. Sinuses/Orbits: No acute finding. Other: None. IMPRESSION: Marked acute hydrocephalus and extensive intraventricular hemorrhage, likely due to spontaneous or hypertensive 1 cm LEFT thalamic hemorrhage. LEFT frontal hemorrhage, approximate volume 6 mL, does not clearly connect with the ventricles. Critical Value/emergent results were called by telephone at the time of interpretation on 07-02-2016 at 2:35 pm to Dr. Coral Else , who verbally acknowledged these results. Electronically Signed   By: Elsie Stain M.D.   On: 07-02-16 14:51     STUDIES:  Venous doppler 2/21 > large volume occlusive DVT in the left lower extremity. Clot extends from the posterior tibial vein to the distal femoral vein. Ct head 2/27 > market acute hydrocephalus and extensive intraventricular hemorrhage. Once and medial left  thalamic hemorrhage. Left frontal hemorrhage approximately 6 mL volume.  CULTURES:   ANTIBIOTICS:   SIGNIFICANT EVENTS: 2/21 - [facility] and diagnosed with occlusive left lower show any DVT 2/27 - to OR for mechanical thrombectomy, suffer pulmonary embolism during the procedure was treated with TPA. Then suffered ICH.  LINES/TUBES: Endotracheal tube 2/27 >  DISCUSSION: 76 year old female with recently diagnosed DVT presented for mechanical thrombectomy.   ASSESSMENT / PLAN:  PULMONARY A: Acute hypoxemic respiratory failure secondary to pulmonary embolism  Inability to protect airway secondary to  decreased hemorrhage  P:   Mechanical ventilatory support Chest x-ray for ET tube placement ABG Ventilator associated pneumonia prevention protocol  CARDIOVASCULAR A:  Hypertension   P:  Telemetry monitoring  Systolic blood pressure goal less than 140 mmHg  Will order PRN IV pushes, may need antihypertensive infusion  RENAL A:   No acute issues  P:   CMP stat Monitor urine output  GASTROINTESTINAL A:   No acute issues  P:   NPO Protonix for SUP  HEMATOLOGIC A:   Coagulopathy secondary to xarelto, tpa, and heparin administration, now with ICH Pulmonary Embolism LLE occlusive DVT s/p mechanical thrombectomy  P:  KCentra, protamine, and cryoprecipitate for reversal Trend CBC Trend INR Will have to forego DVT/PE anticoagulation in setting ICH Vascular following  INFECTIOUS A:   No acute issues  P:   Follow WBC and fever curve  ENDOCRINE A:   No acute issues  P:   Follow glucose   NEUROLOGIC A:   Intracranial hemorrhage following TPA administration for PE  P:   RASS goal: 0 to -1 Propofol and fentanyl infusion  Neurosurgery following   FAMILY  - Updates: Family updated by Dr. Vassie LollAlva  - Inter-disciplinary family meet or Palliative Care meeting due by: 3/3   Joneen RoachPaul Hoffman, AGACNP-BC [facility] Pager [phone] or [phone]  06/26/2016 3:52 PM

## 2016-06-25 NOTE — Progress Notes (Signed)
Summary of today's events:  The patient came in today for thrombolysis of her left leg DVT which was associated with significant pain and swelling.  She was approximately 15 days out from her initial presentation.  We had a lengthy discussion yesterday in the office and as a result decided to reduce her clot burden today given the severity of her symptoms.  During the procedure, after administration of 10 mg of catheter directed TPA to her left popliteal DVT, she became obtunded and hypoxic.  Because of her clinical presentation, my suspicion was that she had suffered a PE.  She had been on Xaralto for several days.  I elected to bolus her with 6000 units of heparin while I awaited additional TPA to be prepared from pharmacy.  Shortly after this, she became responsive.  She was following commands and now had O2 sats of 100%.  When the TPA arrived, I administered an additional 10 mg via a catheter in the RA.  Because she clinically was improving and nearly back to baseline, I elected to continue.  I also considered that her mental status change was due to the 4mg  of Versed and 100 mcg of fentanyl she had received.  She did not get Narcan because she was dramatically improved.  I then performed mechanical thrombectomy, but stopped after 81 seconds and one full pass through the popliteal, femoral, and common femoral vein as she developed bradycardia )a know issue with this procedure).  I finally proceeded with balloon venoplasty of the popliteal and femoral vein and performed a completion venogram which showed restored patency fo the venous system in her left leg.  I was very pleased with the angiographic result.  I then spoke with the patient and felt that she was doing well, although she was a little groggy.  She was neurologically intact.  We checked a ACT which was normal and then removed her popliteal sheath and hell pressure for 10 minutes.  When she was transferred to the stretcher, she was conversant but  slightly confused.  She said she knew she was in the operating room but said the president was Bush.  Just to be on the safe side, I sent her for a STAT head CT.  I was notified by radiology that she had a significant head bleed.  I then found Dr. Conchita ParisNundkumar in the OR and shoed him the CT scan and asked him to consult.  We spoke with pharmacy to help with reversing the recent anticoagulation.  The patient was transferred to the neuro ICU.  Dr. Vassie LollAlva spoke with the family about intubation due to her progressive decline in mental status.  Once her reveral agents were administered, Dr. Conchita ParisNundkumar placed a ventriculostomy.  I spoke with the family initially after the procedure, and then 2 separate occasions regarding what had transpired after the procedure.  I discussed the potential outcomes and what to expect overnight.  They were understanding of the gravity of the situation.  I will continue to update them on changes.  I appreciate the assistance of Dr. Vassie LollAlva and Conchita ParisNundkumar.   Christina Barnett

## 2016-06-25 NOTE — Procedures (Signed)
Intubation Procedure Note Milcah Dulany [id number] 05/10/1940  Procedure: Intubation Indications: Airway protection and maintenance  Procedure Details Consent: Risks of procedure as well as the alternatives and risks of each were explained to the (patient/caregiver).  Consent for procedure obtained. Time Out: Verified patient identification, verified procedure, site/side was marked, verified correct patient position, special equipment/implants available, medications/allergies/relevent history reviewed, required imaging and test results available.  Performed  Maximum sterile technique was used including gloves, hand hygiene and mask.  MAC and 3    Evaluation Hemodynamic Status: BP stable throughout; O2 sats: stable throughout Patient's Current Condition: stable Complications: No apparent complications Patient did tolerate procedure well. Chest X-ray ordered to verify placement.  CXR: pending.   Tameron Lama V. 06/21/2016

## 2016-06-25 NOTE — Op Note (Signed)
PREOP DX:  1. Hydrocephalus 2. IVH  POSTOP DX: Same  PROCEDURE: Right frontal ventriculostomy   SURGEON: Dr. Lisbeth RenshawNeelesh Jacques Willingham, MD  ANESTHESIA: IV Sedation (propofol and fentanyl) with Local  EBL: Minimal  SPECIMENS: None  COMPLICATIONS: None  CONDITION: Hemodynamically stable  INDICATIONS: Mrs. [name] is a 76 y.o. female who developed IVH during thrombectomy of a RLE DVT. Her anticoagulants were reversed with cyroprecipitate, KCentra, and protamine. Consent was obtained from the family.  PROCEDURE IN DETAIL: After consent was obtained from the patient's family, skin of the right frontal scalp was clipped, prepped and draped in the usual sterile fashion.  Scalp was then infiltrated with local anesthetic with epinephrine.  Skin incision was made sharply, and twist drill burr hole was made.  The dura was then incised, and the ventricular catheter was passed in 1 attempt into the right lateral ventricle.  Good CSF flow was obtained.  The catheter was then tunneled subcutaneously and connected to a drainage system and the skin incision closed.  The drain was then secured in place.  FINDINGS: 1. Opening pressure ~20cm H2O 2. Blood tinged CSF

## 2016-06-25 NOTE — Progress Notes (Signed)
eLink Physician-Brief Progress Note Patient Name: Ernst SpellCarolyn Bourcier DOB: [birth year] MRN: [id number]   Date of Service  06/05/2016  HPI/Events of Note  CXR reviewed- ETT too low Needs ulcer prophylaxis  eICU Interventions  Retract ETT by 4 cm Order pepcid     Intervention Category Major Interventions: Respiratory failure - evaluation and management  Flemon Kelty 06/09/2016, 6:45 PM

## 2016-06-25 NOTE — Interval H&P Note (Signed)
History and Physical Interval Note:  06/07/2016 10:56 AM  Christina Barnett  has presented today for surgery, with the diagnosis of Left Leg DVT  The various methods of treatment have been discussed with the patient and family. After consideration of risks, benefits and other options for treatment, the patient has consented to  Procedure(s): Lower Extremity Venography (Left) Intravascular Ultrasound/IVUS (N/A) Peripheral Vascular Thrombolysis (N/A) as a surgical intervention .  The patient's history has been reviewed, patient examined, no change in status, stable for surgery.  I have reviewed the patient's chart and labs.  Questions were answered to the patient's satisfaction.     Durene CalBrabham, [place]

## 2016-06-25 NOTE — Progress Notes (Signed)
RN called RT because Radiology called RN with instructions to pull back ett 3-5 cm. Pt intubated at 24 at the lip by ccm md. RT called e-link to verify amount needed to pull back. Per Dr Isaiah SergeMannam, rectract ett by 4cm. RT will continue to monitor.

## 2016-06-25 NOTE — Significant Event (Signed)
Rapid Response Event Note  Overview:  Team page to radiology - CT scan Time Called: 1427 Arrival Time: 1429 Event Type: Neurologic  Initial Focused Assessment: On arrival patient supine on stretcher - grey in color - opens eyes to name but very lethargic - not following commands - localizes to pain - left greater than right - pupils 3mm = reactive - mumbling but no coherent speech - strong pulse - resps regular deep rate 14 - PV lab RN's present - report patient had procedure for DVT and received tPA and Heparin - post procedure patient with decreased LOC - Dr. Myra GianottiBrabham ordered stat CT scan - new large ICH noted with hydrocephalus per radiology report.   RN staff report patient neuro status declining quickly - was following some commands 15 - 20 minutes ago.  Patient was outpatient to be admitted.     Interventions:  Stat call to Dr. Myra GianottiBrabham with update on status - MD is in surgery - he called neurosurgery to evaluate. HOB elevated.  Patient with stable airway at present - [facility].  Call to ICU Heather with situation - stat transfer to ICU with RT Katie, [facility] and myself.  Dr. Vassie LollAlva PCCM and Dr. Conchita ParisNundkumar to bedside.  Dr. Myra GianottiBrabham updated by Fulton MoleMarsha RN.  Family to ICU - updated by Dr. Myra GianottiBrabham - Dr. Vassie LollAlva and Dr. Conchita ParisNundkumar Support given.  Handoff to ICU staff.    Plan of Care (if not transferred):  Event Summary: Name of Physician Notified: Dr. Myra GianottiBrabham at 1429 (by PV cath RN's)  Name of Consulting Physician Notified: PCCM  Dr. Vassie LollAlva at  (paged at 1442)  Outcome: Transferred (Comment) ([id number])     Christina Barnett, Christina Barnett

## 2016-06-25 NOTE — Op Note (Signed)
-   Patient name: Christina Barnett MRN: [id number] DOB: 1941-03-29 Sex: female  06/19/2016 Pre-operative Diagnosis: Left leg DVT Post-operative diagnosis:  Same Surgeon:  Durene Cal Procedure Performed:  1.  Ultrasound-guided access, left popliteal vein  2.  Inferior venacavogram  3.  Left lower extremity venogram  4.  IVUS evaluation of the left popliteal, femoral, common femoral, external iliac, common iliac, and inferior vena cava  5.  Catheter directed thrombolysis of the left popliteal, femoral, common femoral vein  6.  Mechanical thrombectomy of the left popliteal, femoral, common femoral vein (88 seconds)  7.  Balloon angioplasty of the left popliteal, femoral, common femoral, external iliac, common iliac vein  8.  Systemic thrombolysis administered with the catheter in the right atrium  9.  Conscious sedation (113 minutes)   Indications:  The patient presented with a left leg DVT which was very painful and swollen.  We had an extensive discussion regarding her treatment options and I decided to proceed with thrombolytic lysis given the degree of swelling and discomfort that she is experiencing.  Procedure:  The patient was identified in the holding area and taken to room 8.  The patient was then placed supine on the table and prepped and draped in the usual sterile fashion.  A time out was called.  Conscious sedation was administered with the use of IV fentanyl and Versed in a continuous physician and nurse monitoring.  Heart rate, blood pressure, and oxygen saturations were continuously monitored.  Ultrasound was used to evaluate the left popliteal vein.  It was occluded by ultrasound and very distended.  It was cannulated under ultrasound guidance with a micropuncture needle.  An 018 wire was advanced without resistance followed by micropuncture sheath.  Next over a Benson wire, a 9 French sheath was inserted.  Using a Berenstein 2 catheter and a Benson wire I was able to gain  access into the inferior vena cava.  A contrast injection was performed at this level confirming that the vena cava was widely patent without thrombus.  Next the Amplatz wire was advanced into the internal jugular vein.  I then utilized IVUS to evaluate the left popliteal, femoral, common femoral, external iliac, common iliac and inferior vena cava.  Pullback images were saved.  This revealed thrombus within the femoral and popliteal vein.  There did not appear to be a thrombus within the common femoral and iliac veins with the vena cava.  There is a questionable iliac artery compression of the vein.  Next, 10 mg of TPA was reconstituted and 100 cc of saline.  Pulse spray TPA was administered within the popliteal, femoral, and common femoral vein.  The TPA was allowed to percolate for 15 minutes.  During this period the patient became somewhat unresponsive.  We lost the ability to monitor her oxygen saturations.  I was concerned that she may have had a pulmonary embolism.  She was given 6000 units of IV heparin.  I also administered 10 additional milligrams of TPA with the catheter in the right atrium.  She became more responsive and communicative with no apparent deficits, and therefore elected to continue.  Mechanical thrombectomy using the Zalante catheter was performed of the left popliteal, femoral and common femoral vein.  A total of 88 seconds were performed.  I ended up stopping early because the patient became bradycardic.  Next, I performed balloon venoplasty using a 10 x 40 balloon of the left popliteal, femoral and common femoral vein.  I also took  the balloon up into the external iliac and common iliac vein because there was concern of a possible  May Thurner syndrome.  She did not have a significant waist at this level.  The patient did have a significant stenosis within the mid femoral vein.  Once balloon angioplasty was done, I performed a venogram evaluating the iliofemoral venous system.  We now  had in-line flow through the vein, however there was a residual stenosis within the left femoral vein.  Therefore I reinserted the 10 x 40 balloon and perform repeat balloon angioplasty of this area.  On completion imaging this area looks much improved.  Because of all of the issues that occurred during the procedure I elected to stop at this time.  The patient will be taken to the holding area for sheath pull once her coagulation profile corrects.     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  #3  possible pulmonary embolism during the initiation of catheter directed thrombolysis.  The patient was given systemic TPA which appeared to alleviate her symptoms.  She also became bradycardic with mechanical thrombectomy which shortened the duration of the catheter utilization  #4  restoration of in-line flow through the popliteal, femoral, and iliac veins   V. Durene CalWells Juniper Snyders, M.D. [facility] Office: [phone] Pager:  [phone]

## 2016-06-25 NOTE — Progress Notes (Signed)
Moved EET pulle dback to 22 cm was at 26 moved 4 stat chest ordered to verify correct placement again.. Bilateral breath sounds heard

## 2016-06-26 ENCOUNTER — Inpatient Hospital Stay (HOSPITAL_COMMUNITY): Payer: Medicare Other

## 2016-06-26 DIAGNOSIS — E43 Unspecified severe protein-calorie malnutrition: Secondary | ICD-10-CM | POA: Insufficient documentation

## 2016-06-26 DIAGNOSIS — I610 Nontraumatic intracerebral hemorrhage in hemisphere, subcortical: Secondary | ICD-10-CM

## 2016-06-26 DIAGNOSIS — I82432 Acute embolism and thrombosis of left popliteal vein: Secondary | ICD-10-CM

## 2016-06-26 LAB — BPAM CRYOPRECIPITATE
BLOOD PRODUCT EXPIRATION DATE: 201802272135
BLOOD PRODUCT EXPIRATION DATE: 201802272135
ISSUE DATE / TIME: 201802271542
ISSUE DATE / TIME: 201802271542
UNIT TYPE AND RH: 5100
Unit Type and Rh: 5100

## 2016-06-26 LAB — CBC
HCT: 38.5 % (ref 36.0–46.0)
HEMOGLOBIN: 12.8 g/dL (ref 12.0–15.0)
MCH: 30.5 pg (ref 26.0–34.0)
MCHC: 33.2 g/dL (ref 30.0–36.0)
MCV: 91.9 fL (ref 78.0–100.0)
PLATELETS: 382 10*3/uL (ref 150–400)
RBC: 4.19 MIL/uL (ref 3.87–5.11)
RDW: 12.9 % (ref 11.5–15.5)
WBC: 7 10*3/uL (ref 4.0–10.5)

## 2016-06-26 LAB — BASIC METABOLIC PANEL
Anion gap: 7 (ref 5–15)
BUN: 5 mg/dL — ABNORMAL LOW (ref 6–20)
CHLORIDE: 107 mmol/L (ref 101–111)
CO2: 26 mmol/L (ref 22–32)
CREATININE: 0.4 mg/dL — AB (ref 0.44–1.00)
Calcium: 9.1 mg/dL (ref 8.9–10.3)
GFR calc non Af Amer: >60 mL/min (ref >60)
Glucose, Bld: 101 mg/dL — ABNORMAL HIGH (ref 65–99)
Potassium: 4 mmol/L (ref 3.5–5.1)
Sodium: 140 mmol/L (ref 135–145)

## 2016-06-26 LAB — MAGNESIUM
Magnesium: 2.1 mg/dL (ref 1.7–2.4)
Magnesium: 2.1 mg/dL (ref 1.7–2.4)

## 2016-06-26 LAB — PREPARE CRYOPRECIPITATE
UNIT DIVISION: 0
Unit division: 0

## 2016-06-26 LAB — GLUCOSE, CAPILLARY
GLUCOSE-CAPILLARY: 105 mg/dL — AB (ref 65–99)
GLUCOSE-CAPILLARY: 142 mg/dL — AB (ref 65–99)

## 2016-06-26 LAB — PHOSPHORUS
PHOSPHORUS: 3.4 mg/dL (ref 2.5–4.6)
PHOSPHORUS: 3.1 mg/dL (ref 2.5–4.6)

## 2016-06-26 MED ORDER — CHLORHEXIDINE GLUCONATE CLOTH 2 % EX PADS
6.0000 | MEDICATED_PAD | Freq: Every day | CUTANEOUS | Status: DC
  Administered 2016-06-27 – 2016-06-30 (×4): 6 via TOPICAL

## 2016-06-26 MED ORDER — MUPIROCIN 2 % EX OINT
1.0000 "application " | TOPICAL_OINTMENT | Freq: Two times a day (BID) | CUTANEOUS | Status: DC
  Administered 2016-06-26 – 2016-06-30 (×8): 1 via NASAL
  Filled 2016-06-26: qty 22

## 2016-06-26 MED ORDER — LABETALOL HCL 5 MG/ML IV SOLN
20.0000 mg | INTRAVENOUS | Status: DC | PRN
  Administered 2016-06-27 – 2016-06-30 (×8): 20 mg via INTRAVENOUS
  Filled 2016-06-26 (×8): qty 4

## 2016-06-26 MED ORDER — ORAL CARE MOUTH RINSE
15.0000 mL | OROMUCOSAL | Status: DC
  Administered 2016-06-26 (×3): 15 mL via OROMUCOSAL

## 2016-06-26 MED ORDER — ORAL CARE MOUTH RINSE
15.0000 mL | OROMUCOSAL | Status: DC
  Administered 2016-06-26 – 2016-06-30 (×38): 15 mL via OROMUCOSAL

## 2016-06-26 MED ORDER — CHLORHEXIDINE GLUCONATE 0.12% ORAL RINSE (MEDLINE KIT)
15.0000 mL | Freq: Two times a day (BID) | OROMUCOSAL | Status: DC
  Administered 2016-06-26 – 2016-06-30 (×8): 15 mL via OROMUCOSAL

## 2016-06-26 MED ORDER — VITAL HIGH PROTEIN PO LIQD
1000.0000 mL | ORAL | Status: DC
  Administered 2016-06-26: 1000 mL
  Administered 2016-06-27: 14:00:00
  Administered 2016-06-27: 1000 mL
  Administered 2016-06-27 (×2)
  Administered 2016-06-28: 1000 mL
  Administered 2016-06-28: 20:00:00
  Administered 2016-06-29: 1000 mL

## 2016-06-26 MED ORDER — CHLORHEXIDINE GLUCONATE 0.12 % MT SOLN
OROMUCOSAL | Status: AC
  Administered 2016-06-26: 15 mL via OROMUCOSAL
  Filled 2016-06-26: qty 15

## 2016-06-26 MED ORDER — STROKE: EARLY STAGES OF RECOVERY BOOK
Freq: Once | Status: AC
  Administered 2016-06-26: 13:00:00
  Filled 2016-06-26: qty 1

## 2016-06-26 MED FILL — Heparin Sodium (Porcine) Inj 1000 Unit/ML: INTRAMUSCULAR | Qty: 10 | Status: AC

## 2016-06-26 NOTE — Progress Notes (Signed)
Initial Nutrition Assessment  DOCUMENTATION CODES:   Severe malnutrition in context of chronic illness  INTERVENTION:  - Initiate TF of Vital High Protein at 40 mL/hr (960 mL per day) - Total nutrition (including Propofol at 10 mL/hr) will provide 1224 calories, 84 grams protein, and 806 mL free water daily - Monitor Propofol rate and adjust nutrition accordingly  NUTRITION DIAGNOSIS:   Malnutrition related to chronic illness as evidenced by severe depletion of muscle mass, severe depletion of body fat, moderate to severe fluid accumulation.  GOAL:   Patient will meet greater than or equal to 90% of their needs  MONITOR:   Weight trends, Vent status, TF tolerance, I & O's  REASON FOR ASSESSMENT:   Consult Enteral/tube feeding initiation and management  ASSESSMENT:   Pt with recent diagnosis (2/21) left lower extremity DVT underwent thrombolytic procedure today by vascular surgery. Pt subsequently developed worsening mental status and head CT emergently obtained showed left thalamic hemorrhage extending into the ventricles with acute hydrocephalus. Pt was intubated.   Pt is currently intubated and on ventilator support MV: 7.2 L/min Temp (24hrs), Avg:97.9 F (36.6 C), Min:97.4 F (36.3 C), Max:98.7 F (37.1 C)  Pt is currently on Propofol at a rate of 10 mL/hr, providing 264 calories. RD to monitor Propofol rate and adjust nutrition accordingly.  Family not at bedside at time of visit.  Labs reviewed Medications reviewed; Propofol Pt with IVC drain  Nutrition-focused physical exam completed. Findings are severe fat depletion, severe muscle depletion, and moderate edema.   Diet Order:     Skin:  Reviewed, no issues  Last BM:  PTA  Height:   Ht Readings from Last 1 Encounters:  2016-05-03 5\' 5"  (1.651 m)    Weight:   Wt Readings from Last 1 Encounters:  2016-05-03 123 lb (55.8 kg)    Ideal Body Weight:  56.8 kg  BMI:  Body mass index is 20.47  kg/m.  Estimated Nutritional Needs:   Kcal:  1180  Protein:  80-90 grams  Fluid:  >/= 1.2 L/d  EDUCATION NEEDS:   Education needs no appropriate at this time  [employer]ann[employer] Dietetic Intern

## 2016-06-26 NOTE — Progress Notes (Signed)
PULMONARY / CRITICAL CARE MEDICINE   Name: Christina Barnett MRN: [id number] DOB: December 10, 1940    ADMISSION DATE:  06/06/2016 CONSULTATION DATE:  05/30/2016  REFERRING MD:  Dr. Myra Gianotti  CHIEF COMPLAINT:  ICH  SUBJECTIVE:  No acute events.   VITAL SIGNS: BP 110/72   Pulse 97   Temp 97.9 F (36.6 C) (Axillary)   Resp 15   Ht 5\' 5"  (1.651 m)   Wt 55.8 kg (123 lb)   SpO2 98%   BMI 20.47 kg/m   HEMODYNAMICS:    VENTILATOR SETTINGS: Vent Mode: PRVC FiO2 (%):  [40 %-100 %] 40 % Set Rate:  [15 bmp] 15 bmp Vt Set:  [460 mL] 460 mL PEEP:  [5 cmH20] 5 cmH20 Plateau Pressure:  [21 cmH20-28 cmH20] 21 cmH20  INTAKE / OUTPUT: I/O last 3 completed shifts: In: 2146 [I.V.:1728.8; Blood:367.2; IV Piggyback:50] Out: 1934 [Urine:1815; Drains:119]  PHYSICAL EXAMINATION: General:  Thin elderly female on vent Neuro:  Sedated on vent, not following commands HEENT:  [place]/AT. Pupils 3mm and unreactive.  ETT in place. Cardiovascular:  RRR, no MRG Lungs:  Clear bilateral breath sounds Abdomen:  Soft, non-distended Musculoskeletal:  No acute deformity Skin:  Grossly intact  LABS:  BMET  Recent Labs Lab 06/19/16 1502 06/12/2016 0959 06/08/2016 1833 06/26/16 0314  NA 129* 135 136 140  K 4.0 4.2 4.1 4.0  CL 93* 96* 104 107  CO2 28  --  23 26  BUN 9 5* 6 5*  CREATININE 0.52 0.60 0.49 0.40*  GLUCOSE 110* 102* 115* 101*    Electrolytes  Recent Labs Lab 06/19/16 1502 06/08/2016 1833 06/26/16 0314  CALCIUM 9.1 8.9 9.1    CBC  Recent Labs Lab 06/08/2016 1502 06/02/2016 1833 06/26/16 0314  WBC 9.3 7.6 7.0  HGB 11.7* 12.4 12.8  HCT 35.2* 36.9 38.5  PLT 308 323 382    Coag's  Recent Labs Lab 06/26/2016 1502 06/15/2016 1833  APTT 54* 33  INR 1.30 1.19    Sepsis Markers No results for input(s): LATICACIDVEN, PROCALCITON, O2SATVEN in the last 168 hours.  ABG  Recent Labs Lab 06/19/2016 2100  PHART 7.322*  PCO2ART 54.6*  PO2ART 397*    Liver Enzymes No results for  input(s): AST, ALT, ALKPHOS, BILITOT, ALBUMIN in the last 168 hours.  Cardiac Enzymes No results for input(s): TROPONINI, PROBNP in the last 168 hours.  Glucose No results for input(s): GLUCAP in the last 168 hours.  Imaging Ct Head Wo Contrast  Result Date: 06/20/2016 CLINICAL DATA:  Confusion status post thrombolysis and mechanical thrombectomy of lower extremity DVT earlier today. EXAM: CT HEAD WITHOUT CONTRAST TECHNIQUE: Contiguous axial images were obtained from the base of the skull through the vertex without intravenous contrast. COMPARISON:  10/19/2015. FINDINGS: Brain: Marked acute hydrocephalus, and extensive intraventricular hemorrhage, likely originating from spontaneous or hypertensive LEFT thalamus hemorrhage, with third ventricular extension. Because the hemorrhage in the LEFT thalamus is immediately adjacent to third ventricle, it is difficult to determine the exact size, but estimated to be at least 1 cm. Also, in the LEFT frontal region, there is a 2.6 x 2.1 x 2.2 cm acute hemorrhage, some layering serum, volume 6 mL, does not appear to connect with the dilated LEFT frontal horn. Some subarachnoid blood is seen to exit the BILATERAL foramina of Luschka. Vascular: Blood pool effect within the cerebral vessels due to recent catheterization. Age related calcification of the distal vertebral arteries and cavernous carotid arteries. Skull: Normal. Negative for fracture or focal lesion.  Sinuses/Orbits: No acute finding. Other: None. IMPRESSION: Marked acute hydrocephalus and extensive intraventricular hemorrhage, likely due to spontaneous or hypertensive 1 cm LEFT thalamic hemorrhage. LEFT frontal hemorrhage, approximate volume 6 mL, does not clearly connect with the ventricles. Critical Value/emergent results were called by telephone at the time of interpretation on 2017/02/27 at 2:35 pm to Dr. Coral ElseVANCE BRABHAM , who verbally acknowledged these results. Electronically Signed   By: Elsie StainJohn T Curnes  M.D.   On: 02018/11/01 14:51   Dg Chest Port 1 View  Result Date: 2017/02/27 CLINICAL DATA:  Encounter for endotracheal tube placement. EXAM: PORTABLE CHEST 1 VIEW COMPARISON:  1809 hours on the same day FINDINGS: Endotracheal tube tip is 3.3 above the carina in satisfactory position. Mild hyperinflation of the lungs with minimal blunting of left costophrenic angle. Heart is borderline enlarged with aortic atherosclerosis. Atelectasis and/or scarring at the left lung base is stable. No acute pulmonary edema or pulmonary consolidation. Gastric tube is seen with tip and side port below the left hemidiaphragm in the expected location the stomach. IMPRESSION: 1. Endotracheal tube tip is 3.3 cm above the carina. 2. Satisfactory gastric tube placement. 3. Hyperinflated lungs with bibasilar atelectasis left greater than right. Electronically Signed   By: Tollie Ethavid  Kwon M.D.   On: 02018/11/01 20:28   Dg Chest Port 1 View  Addendum Date: 2017/02/27   ADDENDUM REPORT: 02018/11/01 18:37 ADDENDUM: This addendum is created to add an additional finding. There is an enteric tube with its tip and side-port below the diaphragm in the stomach. Electronically Signed   By: Rubye OaksMelanie  Ehinger M.D.   On: 02018/11/01 18:37   Result Date: 2017/02/27 CLINICAL DATA:  Intubation. EXAM: PORTABLE CHEST 1 VIEW COMPARISON:  None. FINDINGS: The endotracheal tube is 5 mm from the carina, recommend retraction of 3-5 cm. Mild hyperinflation. Streaky bibasilar opacities consistent with atelectasis, possible small right pleural effusion. Heart is normal in size. Atherosclerosis of the aortic arch. No pulmonary edema. No pneumothorax. IMPRESSION: 1. Endotracheal tube tip low in positioning just proximal to the carina. Retraction of 3-5 cm recommended. 2. Bibasilar atelectasis, possible small pleural effusion on the right. 3. Aortic atherosclerosis. These results will be called to the ordering clinician or representative by the Radiologist Assistant,  and communication documented in the PACS or zVision Dashboard. Electronically Signed: By: Rubye OaksMelanie  Ehinger M.D. On: 02018/11/01 18:27     STUDIES:  Venous doppler 2/21 > large volume occlusive DVT in the left lower extremity. Clot extends from the posterior tibial vein to the distal femoral vein. Ct head 2/27 > market acute hydrocephalus and extensive intraventricular hemorrhage. Once and medial left thalamic hemorrhage. Left frontal hemorrhage approximately 6 mL volume. CT head 02/28 >   CULTURES:   ANTIBIOTICS:   SIGNIFICANT EVENTS: 2/21 - [facility] and diagnosed with occlusive left lower show any DVT 2/27 - to OR for mechanical thrombectomy, suffer pulmonary embolism during the procedure was treated with TPA. Then suffered ICH.  LINES/TUBES: Endotracheal tube 2/27 >  DISCUSSION: 76 year old female with recently diagnosed DVT presented for mechanical thrombectomy.   ASSESSMENT / PLAN:  PULMONARY A: Acute hypoxemic respiratory failure secondary to pulmonary embolism  Inability to protect airway secondary to decreased hemorrhage P:   Mechanical ventilatory support Ventilator associated pneumonia prevention protocol Daily SBT's Follow CXR  CARDIOVASCULAR A:  Hypertension - now normotensive on only 1mg /hr cleviprex P:  Telemetry monitoring  Stop cleviprex - anticipate will not need given only on 1mg /hr Labetalol PRN  RENAL A:   No acute issues P:   CMP stat Monitor urine output  GASTROINTESTINAL A:   No acute issues P:   NPO Protonix for SUP Start TF's  HEMATOLOGIC A:   Coagulopathy secondary to xarelto, tpa, and heparin administration, now with ICH - s/p Kcentra, protamine, cryo. Concern for pulmonary Embolism LLE occlusive DVT s/p mechanical thrombectomy P:  Trend CBC Trend INR Will have to forego DVT/PE anticoagulation in setting ICH Vascular following  INFECTIOUS A:   No acute issues P:   Follow WBC and  fever curve  ENDOCRINE A:   No acute issues P:   Follow glucose   NEUROLOGIC A:   Intracranial hemorrhage following TPA administration for presumed PE P:   RASS goal: 0 to -1 Propofol and fentanyl infusion  Daily WUA Repeat head CT today Neurosurgery following   FAMILY  - Updates: Son Aurther Loft updated by me 02/28.  - Inter-disciplinary family meet or Palliative Care meeting due by: 3/3   CC time: 30 min.   Rutherford Guys, [place] - [facility] [place] [facility] Pager: [phone]  or [phone] 06/26/2016, 8:17 AM

## 2016-06-26 NOTE — Progress Notes (Signed)
    Subjective  - POD #1  Remains intubated and sedated   Physical Exam:  Has shown some movement in all extremities    Repeat CT head shows slight improvement   Assessment/Plan:  POD #1  Continue supportive care  Appreciate assistance from neurosurgery and CCM Daughter updated at bedside  Durene CalBrabham, [place] 06/26/2016 6:32 PM --  Vitals:   06/26/16 1700 06/26/16 1800  BP: (!) 161/98 (!) 134/92  Pulse: (!) 105 95  Resp: 19 14  Temp:      Intake/Output Summary (Last 24 hours) at 06/26/16 1832 Last data filed at 06/26/16 1800  Gross per 24 hour  Intake          3026.94 ml  Output             2062 ml  Net           964.94 ml     Laboratory CBC    Component Value Date/Time   WBC 7.0 06/26/2016 0314   HGB 12.8 06/26/2016 0314   HCT 38.5 06/26/2016 0314   PLT 382 06/26/2016 0314    BMET    Component Value Date/Time   NA 140 06/26/2016 0314   K 4.0 06/26/2016 0314   CL 107 06/26/2016 0314   CO2 26 06/26/2016 0314   GLUCOSE 101 (H) 06/26/2016 0314   BUN 5 (L) 06/26/2016 0314   CREATININE 0.40 (L) 06/26/2016 0314   CALCIUM 9.1 06/26/2016 0314   GFRNONAA >60 06/26/2016 0314   GFRAA >60 06/26/2016 0314    COAG Lab Results  Component Value Date   INR 1.19 Aug 01, 2016   INR 1.30 Aug 01, 2016   No results found for: PTT  Antibiotics Anti-infectives    None       V. Charlena CrossWells Brabham IV, M.D. [facility] [place] Office: [phone] Pager:  [phone]

## 2016-06-26 NOTE — Progress Notes (Signed)
Patient ID: Christina Barnett, femaleErnst Barnett   DOB: Jul 02, 1940, 76 y.o.   MRN: [id number] BP (!) 158/102   Pulse 100   Temp 99.3 F (37.4 C) (Axillary)   Resp 19   Ht 5\' 5"  (1.651 m)   Wt 55.7 kg (122 lb 12.7 oz)   SpO2 99%   BMI 20.43 kg/m  Intubated, sedated Not following commands Equivocal pupillary response, normal oculocephalics Ventricular catheter continues to drain bloody fluid Repeat ct improved, ventricles smaller. Continue with current care, no new interventions planned.

## 2016-06-27 ENCOUNTER — Inpatient Hospital Stay (HOSPITAL_COMMUNITY): Payer: Medicare Other

## 2016-06-27 DIAGNOSIS — J9600 Acute respiratory failure, unspecified whether with hypoxia or hypercapnia: Secondary | ICD-10-CM

## 2016-06-27 LAB — GLUCOSE, CAPILLARY
GLUCOSE-CAPILLARY: 136 mg/dL — AB (ref 65–99)
GLUCOSE-CAPILLARY: 152 mg/dL — AB (ref 65–99)
GLUCOSE-CAPILLARY: 90 mg/dL (ref 65–99)
Glucose-Capillary: 170 mg/dL — ABNORMAL HIGH (ref 65–99)
Glucose-Capillary: 123 mg/dL — ABNORMAL HIGH (ref 65–99)
Glucose-Capillary: 103 mg/dL — ABNORMAL HIGH (ref 65–99)

## 2016-06-27 LAB — CBC WITH DIFFERENTIAL/PLATELET
BASOS ABS: 0 10*3/uL (ref 0.0–0.1)
Basophils Relative: 0 %
EOS PCT: 0 %
Eosinophils Absolute: 0 10*3/uL (ref 0.0–0.7)
HCT: 36.9 % (ref 36.0–46.0)
Hemoglobin: 11.7 g/dL — ABNORMAL LOW (ref 12.0–15.0)
LYMPHS PCT: 4 %
Lymphs Abs: 0.6 10*3/uL — ABNORMAL LOW (ref 0.7–4.0)
MCH: 29.8 pg (ref 26.0–34.0)
MCHC: 31.7 g/dL (ref 30.0–36.0)
MCV: 94.1 fL (ref 78.0–100.0)
MONOS PCT: 8 %
Monocytes Absolute: 1.1 10*3/uL — ABNORMAL HIGH (ref 0.1–1.0)
Neutro Abs: 12.5 10*3/uL — ABNORMAL HIGH (ref 1.7–7.7)
Neutrophils Relative %: 88 %
Platelets: 418 10*3/uL — ABNORMAL HIGH (ref 150–400)
RBC: 3.92 MIL/uL (ref 3.87–5.11)
RDW: 13.4 % (ref 11.5–15.5)
WBC: 14.2 10*3/uL — ABNORMAL HIGH (ref 4.0–10.5)

## 2016-06-27 LAB — RENAL FUNCTION PANEL
ALBUMIN: 2.6 g/dL — AB (ref 3.5–5.0)
Anion gap: 7 (ref 5–15)
BUN: 10 mg/dL (ref 6–20)
CALCIUM: 8.8 mg/dL — AB (ref 8.9–10.3)
CO2: 26 mmol/L (ref 22–32)
Chloride: 106 mmol/L (ref 101–111)
Creatinine, Ser: 0.42 mg/dL — ABNORMAL LOW (ref 0.44–1.00)
GFR calc Af Amer: >60 mL/min (ref >60)
GFR calc non Af Amer: >60 mL/min (ref >60)
GLUCOSE: 162 mg/dL — AB (ref 65–99)
PHOSPHORUS: 3 mg/dL (ref 2.5–4.6)
POTASSIUM: 3.4 mmol/L — AB (ref 3.5–5.1)
SODIUM: 139 mmol/L (ref 135–145)

## 2016-06-27 LAB — PHOSPHORUS
Phosphorus: 2.9 mg/dL (ref 2.5–4.6)
Phosphorus: 3.2 mg/dL (ref 2.5–4.6)

## 2016-06-27 LAB — MAGNESIUM
MAGNESIUM: 2.1 mg/dL (ref 1.7–2.4)
Magnesium: 2 mg/dL (ref 1.7–2.4)

## 2016-06-27 MED ORDER — INSULIN ASPART 100 UNIT/ML ~~LOC~~ SOLN
0.0000 [IU] | SUBCUTANEOUS | Status: DC
  Administered 2016-06-27: 2 [IU] via SUBCUTANEOUS
  Administered 2016-06-27: 3 [IU] via SUBCUTANEOUS
  Administered 2016-06-27: 2 [IU] via SUBCUTANEOUS
  Administered 2016-06-27: 3 [IU] via SUBCUTANEOUS
  Administered 2016-06-28 – 2016-06-29 (×8): 2 [IU] via SUBCUTANEOUS
  Administered 2016-06-30: 3 [IU] via SUBCUTANEOUS
  Administered 2016-06-30 (×2): 2 [IU] via SUBCUTANEOUS

## 2016-06-27 MED ORDER — POTASSIUM CHLORIDE 20 MEQ/15ML (10%) PO SOLN
40.0000 meq | Freq: Once | ORAL | Status: AC
  Administered 2016-06-27: 40 meq
  Filled 2016-06-27: qty 30

## 2016-06-27 NOTE — Progress Notes (Signed)
Subjective:  Called by Nurse, Frederica Kuster,, that ventricular catheter was no longer draining.  Objective: Vital signs in last 24 hours: Temp:  [97.5 F (36.4 C)-98.5 F (36.9 C)] 98.2 F (36.8 C) (03/01 2000) Pulse Rate:  [68-96] 72 (03/01 2300) Resp:  [9-27] 15 (03/01 2300) BP: (110-150)/(64-93) 144/79 (03/01 2300) SpO2:  [93 %-100 %] 93 % (03/01 2300) FiO2 (%):  [30 %] 30 % (03/01 2000) Weight:  [57.5 kg (126 lb 12.2 oz)] 57.5 kg (126 lb 12.2 oz) (03/01 0500)  Intake/Output from previous day: 02/28 0701 - 03/01 0700 In: 3609.3 [I.V.:2920.6; NG/GT:588.7; IV Piggyback:100] Out: 990 [Urine:795; Drains:195] Intake/Output this shift: Total I/O In: 467.4 [I.V.:307.4; NG/GT:160] Out: -   Physical Exam: Localizing less readily with left upper extremity, pupils still reactive.  Lab Results:  Recent Labs  06/26/16 0314 06/27/16 0422  WBC 7.0 14.2*  HGB 12.8 11.7*  HCT 38.5 36.9  PLT 382 418*   BMET  Recent Labs  06/26/16 0314 06/27/16 0422  NA 140 139  K 4.0 3.4*  CL 107 106  CO2 26 26  GLUCOSE 101* 162*  BUN 5* 10  CREATININE 0.40* 0.42*  CALCIUM 9.1 8.8*    Studies/Results: Ct Head Wo Contrast  Result Date: 06/27/2016 CLINICAL DATA:  No output of intraventricular drain. Follow-up intracranial hemorrhage. EXAM: CT HEAD WITHOUT CONTRAST TECHNIQUE: Contiguous axial images were obtained from the base of the skull through the vertex without intravenous contrast. COMPARISON:  CT HEAD February 24, 2016 FINDINGS: BRAIN: Ventriculostomy catheter via RIGHT frontal approach, traversing the RIGHT lateral ventricle, distal tip within medial basal ganglia versus third ventricle. Similar interstitial edema about the catheter tract and small hemorrhage. Resolved lateral ventricle dilatation. Third ventricle dilated to 2.2 cm with blood products extending through the cerebral aqua duct to fourth ventricle. Residual dependent blood products in the occipital horns, decreased. Two evolving  LEFT frontal lobe intraparenchymal hematomas dominant component is 3 x 1.6 cm, relatively unchanged. Stable subcentimeter LEFT basal ganglia hemorrhage. Surrounding low-density vasogenic edema. Stable 4 mm LEFT-to-RIGHT midline shift. Patchy additional white matter hypodensities most compatible with chronic small vessel ischemic disease. Small amount of redistributed subarachnoid hemorrhage. Basal cisterns are patent. VASCULAR: Moderate calcific atherosclerosis of the carotid siphons. RIGHT frontal burr hole. SKULL: No skull fracture. No significant scalp soft tissue swelling. SINUSES/ORBITS: RIGHT maxillary sinus air-fluid level with life-support lines in place. The included ocular globes and orbital contents are non-suspicious. OTHER: None. IMPRESSION: Stable RIGHT frontal ventriculostomy catheter ; resolved lateral ventricle dilatation and decreased intraventricular blood products. However, similar distended third ventricle with blood products. Evolving LEFT frontal and LEFT basal ganglia intraparenchymal hematomas. Similar 4 mm LEFT-to-RIGHT midline shift. Small amount of redistributed subarachnoid hemorrhage. Electronically Signed   By: Awilda Metro M.D.   On: 06/27/2016 22:56   Ct Head Wo Contrast  Result Date: 06/26/2016 CLINICAL DATA:  Intracranial hemorrhage EXAM: CT HEAD WITHOUT CONTRAST TECHNIQUE: Contiguous axial images were obtained from the base of the skull through the vertex without intravenous contrast. COMPARISON:  June 26, 2015 FINDINGS: Brain: There is now a shunt catheter with the tip in the lateral aspect of the third ventricle. There remains dilatation of the third and lateral ventricles without appreciable change from 1 day prior. Hemorrhage fills and distends the third ventricle. Hemorrhage extends from the third ventricle into the adjacent upper left thalamus, stable. There is slightly less hemorrhage in the posterior aspect of the frontal horn of the right lateral ventricle.  Hemorrhage is stable in the  frontal horn of the left lateral ventricle. Hemorrhage in the atria of the lateral ventricles is stable. Fourth ventricle is no longer distended and contains less hemorrhage than on the previous study. However, hemorrhage is seen in the fourth ventricle. There is a focal area of parenchymal hemorrhage in the left frontal lobe lateral to the frontal horn left lateral ventricle. This focal area of parenchymal hemorrhage in the left frontal lobe measures 3.1 x 1.9 cm right, are slightly larger than on 1 day prior. A new focus of hemorrhage slightly superior and posterior to this hemorrhage in the left frontal lobe is noted, measuring 1.7 x 0.6 cm. No other new parenchymal hemorrhage is evident. There is 4 mm of midline shift of midline structures toward the right. There are no subdural or epidural fluid collections. There is patchy small vessel disease in the centra semiovale bilaterally, stable. There is evidence of a prior small infarct in the superior mid midportion left centrum semiovale, stable. Hemorrhage is again noted in the foramina of Luschka bilaterally, less than on 1 day prior study. Vascular: No hyperdense vessel is demonstrated. Calcification is noted in each carotid siphon region and distal vertebral arteries. Skull: There is a right frontal bony defect for placement of the shunt catheter. Bony calvarium otherwise appears normal. Air tracks from the catheter placement bony defect into the scout in the superior right frontal lobe, felt to be of postoperative etiology. Sinuses/Orbits: There is mucosal thickening in several ethmoid air cells bilaterally. Other visualized paranasal sinuses are clear. There is leftward deviation of the nasal septum. Visualized orbits appear symmetric bilaterally. Other: Mastoid air cells are clear. IMPRESSION: Shunt catheter now present in the lateral aspect of the right ventricle. Lateral and third ventricles remain dilated. The third ventricle  is distended with hemorrhage. This hemorrhage extends into the upper left thalamus. There is extensive hemorrhage throughout the atria of the lateral ventricles. There is hemorrhage in the posterior aspect of the frontal horn of the left lateral ventricle, stable. There is slightly less hemorrhage in the posterior aspect of the frontal horn of the left lateral ventricle. There is less hemorrhage in the fourth ventricle, and the fourth ventricle is slightly less distended compared to 1 day prior. There is less hemorrhage in the foramina of Luschka bilaterally compared to 1 day prior. Overall increase in hemorrhage noted in the parenchyma of the left frontal lobe. Slight midline shift toward the right. Foci of arterial vascular calcification again noted. Mild ethmoid sinus disease. Air tracks in the right frontal scalp region from the catheter placement from the right frontal region. Electronically Signed   By: Bretta BangWilliam  Woodruff III M.D.   On: 06/26/2016 09:09    Assessment/Plan: Ventricular drain remains well positioned on CT scan and ventricular system is collapsed, indicating drain has been working.  Third ventricle and base of brain is full of blood, but this is not drainable.  No new hemorrhage or shift.  We will see if drain begins to work again.  If it does not, yet ventricles do not enlarge, then patient does not require IVC.  If ventricles enlarge and drain still not functioning, then it may need to be replaced.  Patient's exam is poor and her prognosis is extremely guarded.    LOS: 2 days    Dorian HeckleSTERN,Ahmari Duerson D, MD 06/27/2016, 11:22 PM

## 2016-06-27 NOTE — Progress Notes (Signed)
eLink Physician-Brief Progress Note Patient Name: Ernst SpellCarolyn Davids DOB: [birth year] MRN: [id number]   Date of Service  06/27/2016  HPI/Events of Note  Hyperglycemia. Started on tube feeds.   eICU Interventions  Sliding scale insulin ordered.      Intervention Category Major Interventions: Hyperglycemia - active titration of insulin therapy  Louann SjogrenJose Angelo A De Dios 06/27/2016, 3:58 AM

## 2016-06-27 NOTE — Progress Notes (Signed)
Pt's IVC drain has had continuous output throughout the entire day - at 2100, attempted to empty pt's IVC drain chamber for the hour. Noticed that there was no output. After attempting to decrease drain as low as possible (for a few seconds to see if there would be any output), notified on-call neurosurgery MD about pt's current situation.   Given orders to obtain stat CT and call MD back following scan.  Based on CT results, MD states drain is working appropriately and no further orders were given at this time. During that conversation, informed MD that pt has a decrease in response to pain - was localizing in LUE and now more like a slight withdraw. No further interventions/orders were given based on that information as well.  Will continue to monitor closely.  Francia GreavesSavannah R Tally Mckinnon, RN

## 2016-06-27 NOTE — Progress Notes (Signed)
Pt's most recent CBG - 170 at 0334. Steadily increasing throughout the night and tube feeding started during AM shift on 06/26/2016. Notified CCM via eLink of current blood glucose measurement as it was ordered to inform if CBG >160. MD gave new orders for sliding scale insulin.  Will continue to monitor closely.  Francia GreavesSavannah R Jetaime Pinnix, RN

## 2016-06-27 NOTE — Progress Notes (Addendum)
  [facility] Progress Note  Subjective  - POD #2  Intubated and sedated. Not following commands.   Objective Vitals:   06/27/16 0830 06/27/16 0845  BP: (!) 141/79 115/78  Pulse: 77 71  Resp: (!) 9 15  Temp:      Intake/Output Summary (Last 24 hours) at 06/27/16 0935 Last data filed at 06/27/16 0800  Gross per 24 hour  Intake          3486.19 ml  Output             1024 ml  Net          2462.19 ml   Sedated on vent. Not following commands. Left leg swelling. DP and PT doppler signals bilaterally.  Sanguinous output from ventricular catheter  Assessment/Planning: 76 y.o. female is with ICH s/p thrombolysis LLE DVT 2 Days Post-Op   Not following commands this am.  For repeat CT scan today.  DVT prophylaxis: Will need IVC filter at some point.  UOP low. Creatinine stable. Continue IVF. Will d/c with Dr. Myra GianottiBrabham.  Appreciate CCM and neurosurgery assistance with this patient.   Raymond GurneyKimberly A Trinh 06/27/2016 9:35 AM --  Laboratory CBC    Component Value Date/Time   WBC 14.2 (H) 06/27/2016 0422   HGB 11.7 (L) 06/27/2016 0422   HCT 36.9 06/27/2016 0422   PLT 418 (H) 06/27/2016 0422    BMET    Component Value Date/Time   NA 139 06/27/2016 0422   K 3.4 (L) 06/27/2016 0422   CL 106 06/27/2016 0422   CO2 26 06/27/2016 0422   GLUCOSE 162 (H) 06/27/2016 0422   BUN 10 06/27/2016 0422   CREATININE 0.42 (L) 06/27/2016 0422   CALCIUM 8.8 (L) 06/27/2016 0422   GFRNONAA >60 06/27/2016 0422   GFRAA >60 06/27/2016 0422    COAG Lab Results  Component Value Date   INR 1.19 December 26, 2016   INR 1.30 December 26, 2016   No results found for: PTT  Antibiotics Anti-infectives    None       Maris BergerKimberly Trinh, PA-C [facility] Office: [phone] Pager: [phone] 06/27/2016 9:35 AM  Agree with the above.  Continue supportive care for now.  Will discuss with neurosurgery.  I will contact Palliative Care for their assistance  WElls  Kingstyn Deruiter

## 2016-06-27 NOTE — Care Management Note (Signed)
Case Management Note  Patient Details  Name: Christina Barnett MRN: [id number] Date of Birth: [birth year]  Subjective/Objective:  Pt admitted on 06/16/2016 s/p thalamic IVH post thrombectomy procedure.  PTA, pt independent, lives with spouse.                   Action/Plan: Pt currently remains intubated.  Will follow for discharge planning as pt progresses.  Expected Discharge Date:                  Expected Discharge Plan:     In-House Referral:     Discharge planning Services  CM Consult  Post Acute Care Choice:    Choice offered to:     DME Arranged:    DME Agency:     HH Arranged:    HH Agency:     Status of Service:  In process, will continue to follow  If discussed at Long Length of Stay Meetings, dates discussed:    Additional Comments:  Quintella BatonJulie W. Sherilyn Windhorst, RN, BSN  Trauma/Neuro ICU Case Manager [phone]

## 2016-06-27 NOTE — Progress Notes (Signed)
Patient ID: Christina SpellCarolyn Furno, female   DOB: 1940-07-15, 76 y.o.   MRN: [id number] BP 119/71   Pulse 78   Temp 98.2 F (36.8 C) (Axillary)   Resp 18   Ht 5\' 5"  (1.651 m)   Wt 57.5 kg (126 lb 12.2 oz)   SpO2 100%   BMI 21.09 kg/m  Eyes open, not following commands Pupils small equal, round. Not clear if they are reactive corneals intact Localizes at times with left upper extremity Ventricular catheter drainage remains bloody.  No new recommendations

## 2016-06-27 NOTE — Progress Notes (Signed)
PULMONARY / CRITICAL CARE MEDICINE   Name: Christina Barnett MRN: [id number] DOB: [birth year]    ADMISSION DATE:  06/11/2016 CONSULTATION DATE:  06/14/2016  REFERRING MD:  Dr. Myra Gianotti  CHIEF COMPLAINT:  ICH  SUBJECTIVE:  No acute events. UOP low.   VITAL SIGNS: BP (!) 142/83   Pulse 87   Temp 98.5 F (36.9 C) (Axillary)   Resp (!) 9   Ht 5\' 5"  (1.651 m)   Wt 57.5 kg (126 lb 12.2 oz)   SpO2 100%   BMI 21.09 kg/m   HEMODYNAMICS:    VENTILATOR SETTINGS: Vent Mode: PSV;CPAP FiO2 (%):  [30 %] 30 % Set Rate:  [15 bmp] 15 bmp Vt Set:  [460 mL] 460 mL PEEP:  [5 cmH20] 5 cmH20 Pressure Support:  [5 cmH20] 5 cmH20 Plateau Pressure:  [13 cmH20-18 cmH20] 18 cmH20  INTAKE / OUTPUT: I/O last 3 completed shifts: In: 5118.6 [I.V.:4379.9; NG/GT:588.7; IV Piggyback:150] Out: 2496 [Urine:2210; Drains:286]  PHYSICAL EXAMINATION: General:  Thin elderly female in NAD Neuro:  Sedated on vent, follows some commands.  Flaccid on RUE, has spontaneous movements in other extremities though.  Ventric drain in place with bloody drainage. HEENT:  [place]/AT. Pupils 3mm and unreactive.  ETT in place. Cardiovascular:  RRR, no MRG Lungs:  Clear bilateral breath sounds Abdomen:  Soft, non-distended Musculoskeletal:  No acute deformity Skin:  Grossly intact  LABS:  BMET  Recent Labs Lab 06/26/2016 1833 06/26/16 0314 06/27/16 0422  NA 136 140 139  K 4.1 4.0 3.4*  CL 104 107 106  CO2 23 26 26   BUN 6 5* 10  CREATININE 0.49 0.40* 0.42*  GLUCOSE 115* 101* 162*    Electrolytes  Recent Labs Lab 06/20/2016 1833 06/26/16 0314 06/26/16 1403 06/26/16 1654 06/27/16 0422  CALCIUM 8.9 9.1  --   --  8.8*  MG  --   --  2.1 2.1 2.0  PHOS  --   --  3.4 3.1 2.9  3.0    CBC  Recent Labs Lab 06/15/2016 1833 06/26/16 0314 06/27/16 0422  WBC 7.6 7.0 14.2*  HGB 12.4 12.8 11.7*  HCT 36.9 38.5 36.9  PLT 323 382 418*    Coag's  Recent Labs Lab 06/15/2016 1502 06/24/2016 1833  APTT 54* 33   INR 1.30 1.19    Sepsis Markers No results for input(s): LATICACIDVEN, PROCALCITON, O2SATVEN in the last 168 hours.  ABG  Recent Labs Lab 06/06/2016 2100  PHART 7.322*  PCO2ART 54.6*  PO2ART 397*    Liver Enzymes  Recent Labs Lab 06/27/16 0422  ALBUMIN 2.6*    Cardiac Enzymes No results for input(s): TROPONINI, PROBNP in the last 168 hours.  Glucose  Recent Labs Lab 06/26/16 1917 06/26/16 2328 06/27/16 0334 06/27/16 0744  GLUCAP 105* 142* 170* 136*    Imaging Ct Head Wo Contrast  Result Date: 06/26/2016 CLINICAL DATA:  Intracranial hemorrhage EXAM: CT HEAD WITHOUT CONTRAST TECHNIQUE: Contiguous axial images were obtained from the base of the skull through the vertex without intravenous contrast. COMPARISON:  June 26, 2015 FINDINGS: Brain: There is now a shunt catheter with the tip in the lateral aspect of the third ventricle. There remains dilatation of the third and lateral ventricles without appreciable change from 1 day prior. Hemorrhage fills and distends the third ventricle. Hemorrhage extends from the third ventricle into the adjacent upper left thalamus, stable. There is slightly less hemorrhage in the posterior aspect of the frontal horn of the right lateral ventricle. Hemorrhage is stable in  the frontal horn of the left lateral ventricle. Hemorrhage in the atria of the lateral ventricles is stable. Fourth ventricle is no longer distended and contains less hemorrhage than on the previous study. However, hemorrhage is seen in the fourth ventricle. There is a focal area of parenchymal hemorrhage in the left frontal lobe lateral to the frontal horn left lateral ventricle. This focal area of parenchymal hemorrhage in the left frontal lobe measures 3.1 x 1.9 cm right, are slightly larger than on 1 day prior. A new focus of hemorrhage slightly superior and posterior to this hemorrhage in the left frontal lobe is noted, measuring 1.7 x 0.6 cm. No other new parenchymal  hemorrhage is evident. There is 4 mm of midline shift of midline structures toward the right. There are no subdural or epidural fluid collections. There is patchy small vessel disease in the centra semiovale bilaterally, stable. There is evidence of a prior small infarct in the superior mid midportion left centrum semiovale, stable. Hemorrhage is again noted in the foramina of [place] bilaterally, less than on 1 day prior study. Vascular: No hyperdense vessel is demonstrated. Calcification is noted in each carotid siphon region and distal vertebral arteries. Skull: There is a right frontal bony defect for placement of the shunt catheter. Bony calvarium otherwise appears normal. Air tracks from the catheter placement bony defect into the scout in the superior right frontal lobe, felt to be of postoperative etiology. Sinuses/Orbits: There is mucosal thickening in several ethmoid air cells bilaterally. Other visualized paranasal sinuses are clear. There is leftward deviation of the nasal septum. Visualized orbits appear symmetric bilaterally. Other: Mastoid air cells are clear. IMPRESSION: Shunt catheter now present in the lateral aspect of the right ventricle. Lateral and third ventricles remain dilated. The third ventricle is distended with hemorrhage. This hemorrhage extends into the upper left thalamus. There is extensive hemorrhage throughout the atria of the lateral ventricles. There is hemorrhage in the posterior aspect of the frontal horn of the left lateral ventricle, stable. There is slightly less hemorrhage in the posterior aspect of the frontal horn of the left lateral ventricle. There is less hemorrhage in the fourth ventricle, and the fourth ventricle is slightly less distended compared to 1 day prior. There is less hemorrhage in the foramina of [place] bilaterally compared to 1 day prior. Overall increase in hemorrhage noted in the parenchyma of the left frontal lobe. Slight midline shift toward the  right. Foci of arterial vascular calcification again noted. Mild ethmoid sinus disease. Air tracks in the right frontal scalp region from the catheter placement from the right frontal region. Electronically Signed   By: Bretta Bang III M.D.   On: 06/26/2016 09:09     STUDIES:  Venous doppler 2/21 > large volume occlusive DVT in the left lower extremity. Clot extends from the posterior tibial vein to the distal femoral vein. Ct head 2/27 > market acute hydrocephalus and extensive intraventricular hemorrhage. Once and medial left thalamic hemorrhage. Left frontal hemorrhage approximately 6 mL volume. CT head 02/28 > shunt in place in right ventricle.  Ventricles slightly smaller but overall increase in hemorrhage in left frontal lobe. Slight MLS to the right.  CULTURES:   ANTIBIOTICS:   SIGNIFICANT EVENTS: 2/21 - presented to emergency department at [facility] and diagnosed with occlusive left lower show any DVT 2/27 - to OR for mechanical thrombectomy, suffer pulmonary embolism during the procedure was treated with TPA. Then suffered ICH.  LINES/TUBES: Endotracheal tube 2/27 >  DISCUSSION: 75  year old female with recently diagnosed DVT presented for mechanical thrombectomy.   ASSESSMENT / PLAN:  PULMONARY A: Acute hypoxemic respiratory failure secondary to pulmonary embolism  Inability to protect airway secondary to decreased hemorrhage P:   Mechanical ventilatory support Ventilator associated pneumonia prevention protocol Daily SBT's Follow CXR  CARDIOVASCULAR A:  Hypertension - resolved. P:  Telemetry monitoring  Labetalol PRN  RENAL A:   Hypokalemia Oliguria P:   40mEq K per tube Increased MIVF May need renal US Monitor urine output  GASTROINTESTINAL A:   No acute issues P:   NPO Protonix for SUP Start TF's  HEMATOLOGIC A:   Coagulopathy secondary to xarelto, tpa, and heparin administration, now with ICH - s/p Kcentra, protamine, cryo. Concern  for pulmonary Embolism LLE occlusive DVT s/p mechanical thrombectomy P:  Trend CBC Trend INR Will have to forego DVT/PE anticoagulation in setting ICH - needs IVC filter Vascular following  INFECTIOUS A:   No acute issues P:   Follow WBC and fever curve  ENDOCRINE A:   Hyperglycemia P:   SSI  NEUROLOGIC A:   Intracranial hemorrhage following TPA administration for presumed PE P:   RASS goal: 0 to -1 Propofol and fentanyl infusion  Daily WUA Repeat head CT today Neurosurgery following   FAMILY  - Updates: Son Aurther Lofterry updated by me 02/28.  - Inter-disciplinary family meet or Palliative Care meeting due by: 3/3   CC time: 30 min.   Rutherford Guysahul Maxx Calaway, [place] - [facility] [place] [facility] Pager: [phone]  or [phone] 06/27/2016, 8:09 AM

## 2016-06-27 DEATH — deceased

## 2016-06-28 ENCOUNTER — Inpatient Hospital Stay (HOSPITAL_COMMUNITY): Payer: Medicare Other

## 2016-06-28 ENCOUNTER — Encounter (HOSPITAL_COMMUNITY): Payer: Self-pay

## 2016-06-28 DIAGNOSIS — Z515 Encounter for palliative care: Secondary | ICD-10-CM

## 2016-06-28 DIAGNOSIS — J9690 Respiratory failure, unspecified, unspecified whether with hypoxia or hypercapnia: Secondary | ICD-10-CM

## 2016-06-28 DIAGNOSIS — E43 Unspecified severe protein-calorie malnutrition: Secondary | ICD-10-CM

## 2016-06-28 DIAGNOSIS — J9601 Acute respiratory failure with hypoxia: Secondary | ICD-10-CM

## 2016-06-28 LAB — GLUCOSE, CAPILLARY
GLUCOSE-CAPILLARY: 106 mg/dL — AB (ref 65–99)
GLUCOSE-CAPILLARY: 109 mg/dL — AB (ref 65–99)
Glucose-Capillary: 138 mg/dL — ABNORMAL HIGH (ref 65–99)
Glucose-Capillary: 138 mg/dL — ABNORMAL HIGH (ref 65–99)
Glucose-Capillary: 139 mg/dL — ABNORMAL HIGH (ref 65–99)
Glucose-Capillary: 140 mg/dL — ABNORMAL HIGH (ref 65–99)

## 2016-06-28 LAB — CBC WITH DIFFERENTIAL/PLATELET
Basophils Absolute: 0 10*3/uL (ref 0.0–0.1)
Basophils Relative: 0 %
Eosinophils Absolute: 0 10*3/uL (ref 0.0–0.7)
Eosinophils Relative: 0 %
HCT: 35.5 % — ABNORMAL LOW (ref 36.0–46.0)
HEMOGLOBIN: 11.3 g/dL — AB (ref 12.0–15.0)
LYMPHS ABS: 0.9 10*3/uL (ref 0.7–4.0)
LYMPHS PCT: 4 %
MCH: 29.7 pg (ref 26.0–34.0)
MCHC: 31.8 g/dL (ref 30.0–36.0)
MCV: 93.4 fL (ref 78.0–100.0)
Monocytes Absolute: 1.6 10*3/uL — ABNORMAL HIGH (ref 0.1–1.0)
Monocytes Relative: 7 %
NEUTROS PCT: 89 %
Neutro Abs: 19.2 10*3/uL — ABNORMAL HIGH (ref 1.7–7.7)
Platelets: 386 10*3/uL (ref 150–400)
RBC: 3.8 MIL/uL — AB (ref 3.87–5.11)
RDW: 13.6 % (ref 11.5–15.5)
WBC: 21.7 10*3/uL — AB (ref 4.0–10.5)

## 2016-06-28 LAB — RENAL FUNCTION PANEL
Albumin: 2.2 g/dL — ABNORMAL LOW (ref 3.5–5.0)
Anion gap: 8 (ref 5–15)
BUN: 7 mg/dL (ref 6–20)
CALCIUM: 8.6 mg/dL — AB (ref 8.9–10.3)
CHLORIDE: 109 mmol/L (ref 101–111)
CO2: 23 mmol/L (ref 22–32)
CREATININE: 0.38 mg/dL — AB (ref 0.44–1.00)
Glucose, Bld: 150 mg/dL — ABNORMAL HIGH (ref 65–99)
Phosphorus: 2.4 mg/dL — ABNORMAL LOW (ref 2.5–4.6)
Potassium: 3.5 mmol/L (ref 3.5–5.1)
SODIUM: 140 mmol/L (ref 135–145)

## 2016-06-28 LAB — TRIGLYCERIDES: TRIGLYCERIDES: 57 mg/dL (ref <150)

## 2016-06-28 LAB — MAGNESIUM: Magnesium: 1.8 mg/dL (ref 1.7–2.4)

## 2016-06-28 MED ORDER — METOPROLOL TARTRATE 5 MG/5ML IV SOLN
2.5000 mg | INTRAVENOUS | Status: DC | PRN
  Administered 2016-06-28 – 2016-06-30 (×8): 5 mg via INTRAVENOUS
  Filled 2016-06-28 (×7): qty 5

## 2016-06-28 MED ORDER — POTASSIUM CHLORIDE 20 MEQ/15ML (10%) PO SOLN
40.0000 meq | Freq: Once | ORAL | Status: AC
  Administered 2016-06-28: 40 meq
  Filled 2016-06-28: qty 30

## 2016-06-28 MED ORDER — FUROSEMIDE 10 MG/ML IJ SOLN
60.0000 mg | Freq: Once | INTRAMUSCULAR | Status: AC
  Administered 2016-06-28: 60 mg via INTRAVENOUS
  Filled 2016-06-28: qty 6

## 2016-06-28 MED ORDER — METOPROLOL TARTRATE 5 MG/5ML IV SOLN
INTRAVENOUS | Status: AC
  Filled 2016-06-28: qty 5

## 2016-06-28 MED ORDER — FENTANYL 2500MCG IN NS 250ML (10MCG/ML) PREMIX INFUSION
25.0000 ug/h | INTRAVENOUS | Status: DC
  Filled 2016-06-28: qty 250

## 2016-06-28 NOTE — Progress Notes (Addendum)
[facility]   Subjective  - Intubated non responsive.   Objective (!) 177/95 91 98 F (36.7 C) (Oral) 20 91%  Intake/Output Summary (Last 24 hours) at 06/28/16 0915 Last data filed at 06/28/16 0700  Gross per 24 hour  Intake          4445.96 ml  Output             1145 ml  Net          3300.96 ml    Left LE edema, warm with palpable DP bilaterally  CT brain IMPRESSION: Evidence of ventriculostomy malfunction with hydrocephalus of the lateral ventricles.  No evidence of increased intracranial bleeding. Intraventricular blood, left thalamic blood and left frontal blood appear the same.  Ventriculostomy tip could be within the third ventricular hematoma or medial right thalamus.  Assessment/Planning: POD # 3 76 y.o. female is with ICH s/p thrombolysis LLE DVT Hypertensive Labetalol PRN CR stable Does not respond or move   Christina Barnett, Christina Barnett 06/28/2016 9:15 AM --  Laboratory Lab Results:  Recent Labs  06/27/16 0422 06/28/16 0550  WBC 14.2* 21.7*  HGB 11.7* 11.3*  HCT 36.9 35.5*  PLT 418* 386   BMET  Recent Labs  06/27/16 0422 06/28/16 0550  NA 139 140  K 3.4* 3.5  CL 106 109  CO2 26 23  GLUCOSE 162* 150*  BUN 10 7  CREATININE 0.42* 0.38*  CALCIUM 8.8* 8.6*    COAG Lab Results  Component Value Date   INR 1.19 06/02/2016   INR 1.30 06/23/2016   No results found for: PTT

## 2016-06-28 NOTE — Progress Notes (Signed)
Patient placed back on full support at this time due to sats not responding to an increase in FiO2. Recruitment maneuver performed for 2 minutes of PCV 25, Rate 10, PEEP 5, FiO2 100%, and I-time 3.0. Vitals stable. Patient sats up to 100%. RN at bedside.

## 2016-06-28 NOTE — Op Note (Signed)
   2016/10/06  PATIENT:   Christina JonesCarolyn Barnett  76 y.o. female   PRE-OPERATIVE DIAGNOSIS: intraventricular hemorrhage and obstructive hydrocephalus  POST-OPERATIVE DIAGNOSIS:  Same  PROCEDURE:  Right frontal ventricular catheter replacement  SURGEON:  Shulamis Wenberg ANESTHESIA:   local DRAINS: Ventriculostomy Drain in the right  lateral ventricle.   SPECIMEN: none  DICTATION: Christina Barnett had her head shaved and then prepared in a sterile manner. I draped  in a sterile manner. I opened the skin with a 15 blade using the previous incision.I used the existing burr hole passing the ventricular catheter into the lateral ventricle. There was brisk flow of spinal fluid. I tunneled the catheter posteriorly and secured it to the skin at the exit. I approximated the scalp edges with nylon sutures. I placed a sterile dressing, then connected the catheter to the drainage system.   PLAN OF CARE: Admit to inpatient   PATIENT DISPOSITION:  PACU - hemodynamically stable.

## 2016-06-28 NOTE — Progress Notes (Signed)
ETT tube holder changed without complication.

## 2016-06-28 NOTE — Progress Notes (Signed)
Appreciate Palliative care assistance this am  Patient has declined since yesterday.  Ventriculostomy has occluded.  Current plan is to replace the tube  I discussed the possibility of a IVC filter.  The family does not wish to do any additional invasive procedures after the ventriculostomy change today.  If she has noticable clinical improvement by tomorrow we will place a filter then.    Durene CalWells Arda Daggs

## 2016-06-28 NOTE — Progress Notes (Signed)
Patient ID: Ernst SpellCarolyn Nasby, female   DOB: 1940/08/01, 76 y.o.   MRN: [id number]  BP 126/73   Pulse 91   Temp 98.9 F (37.2 C) (Axillary)   Resp 17   Ht 5\' 5"  (1.651 m)   Wt 57.8 kg (127 lb 6.8 oz)   SpO2 97%   BMI 21.20 kg/m  Patient not responsive. Ventricular catheter stopped draining this am. Dr. Venetia MaxonStern assessed catheter function approximately 0300, and though not working well it was still draining. Repeat head ct this am revealed enlarged ventricles implying catheter was no longer functioning. After discussion with the family they have opted to have me replace the catheter. Minimal withdrawal in left upper extremity.  No gag, no pupillary response, though pupils are ~762mm round and regular. No cough.  Prognosis is poor, there has been little change over the last the 48 hours

## 2016-06-28 NOTE — Progress Notes (Signed)
PULMONARY / CRITICAL CARE MEDICINE   Name: Christina Barnett MRN: [id number] DOB: Sep 20, 1940    ADMISSION DATE:  06/18/2016 CONSULTATION DATE:  06/22/2016  REFERRING MD:  Dr. Myra Gianotti  CHIEF COMPLAINT:  ICH  SUBJECTIVE:  Ventric drain without any output overnight.  Repeat head CT overnight stable; however, this AM appears to have enlarged ventricles (official read still pending).  Exam also worse and pt now essentially unresponsive (only withdraws very minimally in RUE). + 6L since admit.    VITAL SIGNS: BP (!) 159/85   Pulse 78   Temp 98 F (36.7 C) (Oral)   Resp 16   Ht 5\' 5"  (1.651 m)   Wt 57.8 kg (127 lb 6.8 oz)   SpO2 98%   BMI 21.20 kg/m   HEMODYNAMICS:    VENTILATOR SETTINGS: Vent Mode: PRVC FiO2 (%):  [30 %] 30 % Set Rate:  [15 bmp] 15 bmp Vt Set:  [460 mL] 460 mL PEEP:  [5 cmH20] 5 cmH20 Pressure Support:  [5 cmH20] 5 cmH20 Plateau Pressure:  [10 cmH20-18 cmH20] 17 cmH20  INTAKE / OUTPUT: I/O last 3 completed shifts: In: 6660.1 [I.V.:5070.1; NG/GT:1440; IV Piggyback:150] Out: 1642 [Urine:1470; Drains:172]  PHYSICAL EXAMINATION: General:  Thin elderly female in NAD Neuro:  Sedated on vent (being turned off now).  Essentially unresponsive this AM with very little withdrawal in RUE.  Drain without drainage. HEENT:  [place]/AT. Pupils 3mm and unreactive.  ETT in place. Cardiovascular:  RRR, no MRG Lungs:  Clear bilateral breath sounds Abdomen:  Soft, non-distended Musculoskeletal:  No acute deformity Skin:  Grossly intact  LABS:  BMET  Recent Labs Lab 06/26/16 0314 06/27/16 0422 06/28/16 0550  NA 140 139 140  K 4.0 3.4* 3.5  CL 107 106 109  CO2 26 26 23   BUN 5* 10 7  CREATININE 0.40* 0.42* 0.38*  GLUCOSE 101* 162* 150*    Electrolytes  Recent Labs Lab 06/26/16 0314  06/27/16 0422 06/27/16 1659 06/28/16 0550  CALCIUM 9.1  --  8.8*  --  8.6*  MG  --   < > 2.0 2.1 1.8  PHOS  --   < > 2.9  3.0 3.2 2.4*  < > = values in this interval not  displayed.  CBC  Recent Labs Lab 06/26/16 0314 06/27/16 0422 06/28/16 0550  WBC 7.0 14.2* 21.7*  HGB 12.8 11.7* 11.3*  HCT 38.5 36.9 35.5*  PLT 382 418* 386    Coag's  Recent Labs Lab 06/15/2016 1502 06/08/2016 1833  APTT 54* 33  INR 1.30 1.19    Sepsis Markers No results for input(s): LATICACIDVEN, PROCALCITON, O2SATVEN in the last 168 hours.  ABG  Recent Labs Lab 06/13/2016 2100  PHART 7.322*  PCO2ART 54.6*  PO2ART 397*    Liver Enzymes  Recent Labs Lab 06/27/16 0422 06/28/16 0550  ALBUMIN 2.6* 2.2*    Cardiac Enzymes No results for input(s): TROPONINI, PROBNP in the last 168 hours.  Glucose  Recent Labs Lab 06/27/16 0744 06/27/16 1319 06/27/16 1721 06/27/16 1956 06/27/16 2305 06/28/16 0319  GLUCAP 136* 152* 123* 103* 90 106*    Imaging Ct Head Wo Contrast  Result Date: 06/27/2016 CLINICAL DATA:  No output of intraventricular drain. Follow-up intracranial hemorrhage. EXAM: CT HEAD WITHOUT CONTRAST TECHNIQUE: Contiguous axial images were obtained from the base of the skull through the vertex without intravenous contrast. COMPARISON:  CT HEAD February 24, 2016 FINDINGS: BRAIN: Ventriculostomy catheter via RIGHT frontal approach, traversing the RIGHT lateral ventricle, distal tip within medial basal  ganglia versus third ventricle. Similar interstitial edema about the catheter tract and small hemorrhage. Resolved lateral ventricle dilatation. Third ventricle dilated to 2.2 cm with blood products extending through the cerebral aqua duct to fourth ventricle. Residual dependent blood products in the occipital horns, decreased. Two evolving LEFT frontal lobe intraparenchymal hematomas dominant component is 3 x 1.6 cm, relatively unchanged. Stable subcentimeter LEFT basal ganglia hemorrhage. Surrounding low-density vasogenic edema. Stable 4 mm LEFT-to-RIGHT midline shift. Patchy additional white matter hypodensities most compatible with chronic small vessel ischemic  disease. Small amount of redistributed subarachnoid hemorrhage. Basal cisterns are patent. VASCULAR: Moderate calcific atherosclerosis of the carotid siphons. RIGHT frontal burr hole. SKULL: No skull fracture. No significant scalp soft tissue swelling. SINUSES/ORBITS: RIGHT maxillary sinus air-fluid level with life-support lines in place. The included ocular globes and orbital contents are non-suspicious. OTHER: None. IMPRESSION: Stable RIGHT frontal ventriculostomy catheter ; resolved lateral ventricle dilatation and decreased intraventricular blood products. However, similar distended third ventricle with blood products. Evolving LEFT frontal and LEFT basal ganglia intraparenchymal hematomas. Similar 4 mm LEFT-to-RIGHT midline shift. Small amount of redistributed subarachnoid hemorrhage. Electronically Signed   By: Awilda Metro M.D.   On: 06/27/2016 22:56     STUDIES:  Venous doppler 2/21 > large volume occlusive DVT in the left lower extremity. Clot extends from the posterior tibial vein to the distal femoral vein. Ct head 2/27 > market acute hydrocephalus and extensive intraventricular hemorrhage. Once and medial left thalamic hemorrhage. Left frontal hemorrhage approximately 6 mL volume. CT head 02/28 > shunt in place in right ventricle.  Ventricles slightly smaller but overall increase in hemorrhage in left frontal lobe. Slight MLS to the right. CT head 03/01 > stable right frontal ventric catheter.  Similar hemorrhage in left basal ganglia and left frontal lobe. CT head 03/02 >   CULTURES:   ANTIBIOTICS:   SIGNIFICANT EVENTS: 2/21 - presented to emergency department at [facility] and diagnosed with occlusive left lower show any DVT 2/27 - to OR for mechanical thrombectomy, suffer pulmonary embolism during the procedure was treated with TPA. Then suffered ICH.  LINES/TUBES: Endotracheal tube 2/27 >  DISCUSSION: 76 year old female with recently diagnosed DVT presented for  mechanical thrombectomy.   ASSESSMENT / PLAN:  PULMONARY A: Acute hypoxemic respiratory failure secondary to pulmonary embolism  Inability to protect airway secondary to decreased hemorrhage P:   Mechanical ventilatory support. Ventilator associated pneumonia prevention protocol. Daily SBT's - unable this AM given poor neuro exam. Follow CXR.  CARDIOVASCULAR A:  Hypertension - resolved. P:  Telemetry monitoring. Labetalol PRN.  RENAL A:   Hypokalemia - resolved. Oliguria with volume overload - 6L positive since admit. P:   60mg  lasix. K. May need renal US. Monitor urine output.  GASTROINTESTINAL A:   No acute issues P:   NPO. Protonix for SUP. Continue TF's.  HEMATOLOGIC A:   Coagulopathy secondary to xarelto, tpa, and heparin administration, now with ICH - s/p Kcentra, protamine, cryo. Concern for pulmonary Embolism LLE occlusive DVT s/p mechanical thrombectomy P:  Trend CBC. Trend INR. Will have to forego DVT/PE anticoagulation in setting ICH - needs IVC filter. Vascular following.  INFECTIOUS A:   No acute issues. P:   Follow WBC and fever curve.  ENDOCRINE A:   Hyperglycemia. P:   SSI.  NEUROLOGIC A:   Intracranial hemorrhage following TPA administration for presumed PE - had IVC drain in place but stopped draining PM 03/01.  New head CT 03/02 appears to have enlarged  ventricles but official read still pending.  Exam also much worse today. P:   RASS goal: 0 to -1 Propofol and fentanyl infusion  Daily WUA Neurosurgery following, will discuss CT findings from today. Will likely unfortunately need palliative care discussions.   FAMILY  - Updates: Son Aurther Lofterry updated by me 02/28.  - Inter-disciplinary family meet or Palliative Care meeting due by: 3/3   CC time: 30 min.   Rutherford Guysahul Tonea Leiphart, [place] - C [place] [facility] Pager: [phone]  or [phone] 06/28/2016, 7:12 AM

## 2016-06-28 NOTE — Consult Note (Signed)
Consultation Note Date: 06/28/2016   Patient Name: Christina Barnett  DOB: [birth year]  MRN: [id number]  Age / Sex: 76 y.o., female  PCP: Celedonio Savage, MD Referring Physician: Serafina Mitchell, MD  Reason for Consultation: Establishing goals of care and Psychosocial/spiritual support, possible withdrawal of ventilator  HPI/Patient Profile: 76 y.o. female  with past medical history of Shortness of breath, anxiety, chronic back pain, admitted on 06/13/2016 with right lower extremity DVT. Patient reports that DVT pain and swelling had gotten worse over the previous 10 days. She denied any history of trauma. Ultrasound was positive for occlusive thrombus within the popliteal vein. She underwent a thrombectomy of the right lower extremity and ultimately a right frontal ventricular ostomy, IVC placement. Patient has taken a turn for the worse overnight. CT scan performed 06-28-2016 show evidence of ventriculostomy malfunction. Patient is not localizing to pain as she was yesterday. Per nursing she responded to pain with her left upper extremity but she is not reacting today..   Clinical Assessment and Goals of Care: Patient is unresponsive, on ventilator, as well as sedation. She does not withdraw to pain or other noxious stimuli. Christina Barnett is at the bedside. He states his father is elderly and that he and his other brother and sister are helping him with decisions. He does share with me that his parents had a living will that stated she would not want to live in a vegetative state.  Her husband is still legally her healthcare proxy but her children are helping to make decisions decisions. Met with patient's husband Christina Barnett, sons Christina Barnett and Christina Barnett, and daughter Christina Barnett. Patient had made a living will in which she stated she would not want to be kept alive on a ventilator.    SUMMARY OF RECOMMENDATIONS   Per son, patient's living  will referenced that she would not want to live in a vegetative state on life support Family wishes to continue to treat the treatable but would not escalate care to a trach or PEG nor  would they want them to pursue CPR defibrillation in the event that she declines further They would like to see if putting a new shunt in would change her condition I did talk at length about if she was unable to improve, however he would maintain comfort while we removed the ventilator and pursue a natural death, end-of-life care  Code Status/Advance Care Planning:  DNR    Symptom Management:   Pain: No reaction to noxious stimuli. No nonverbal signs and symptoms of pain ;continue with ICU orders of fentanyl  Agitation: Continues on low-dose propofol  Palliative Prophylaxis:   Aspiration, Bowel Regimen, Delirium Protocol, Frequent Pain Assessment, Oral Care and Turn Reposition  Additional Recommendations (Limitations, Scope, Preferences):  No Chemotherapy, No Hemodialysis, No Radiation and No Tracheostomy  Psycho-social/Spiritual:   Desire for further Chaplaincy support:no  Additional Recommendations: Grief/Bereavement Support  Prognosis:   Hours - Days in the setting of neurological damage secondary to intracranial hemorrhage. Patient's goals are no trach, PEG and nose CPR  Discharge Planning: To Be Determined      Primary Diagnoses: Present on Admission: . ICH (intracerebral hemorrhage) ([place]) . DVT, popliteal, acute ([place])   I have reviewed the medical record, interviewed the patient and family, and examined the patient. The following aspects are pertinent.  Past Medical History:  Diagnosis Date  . Anxiety   . Chronic back pain   . Chronic back pain   . Depression    From back pain  . Shortness of breath    Social History   Social History  . Marital status: Married    Spouse name: N/A  . Number of children: N/A  . Years of education: N/A   Social History Main Topics    . Smoking status: Current Every Day Smoker    Packs/day: 1.00    Years: 50.00    Types: Cigarettes  . Smokeless tobacco: Never Used  . Alcohol use No  . Drug use: No  . Sexual activity: Not on file   Other Topics Concern  . Not on file   Social History Narrative  . No narrative on file   Family History  Problem Relation Age of Onset  . Arthritis     Scheduled Meds: . chlorhexidine gluconate (MEDLINE KIT)  15 mL Mouth Rinse BID  . Chlorhexidine Gluconate Cloth  6 each Topical Q0600  . docusate sodium  100 mg Oral Daily  . famotidine (PEPCID) IV  20 mg Intravenous Q12H  . feeding supplement (VITAL HIGH PROTEIN)  1,000 mL Per Tube Q24H  . fentaNYL (SUBLIMAZE) injection  50 mcg Intravenous Once  . insulin aspart  0-15 Units Subcutaneous Q4H  . mouth rinse  15 mL Mouth Rinse Q2H  . mupirocin ointment  1 application Nasal BID   Continuous Infusions: . sodium chloride 100 mL/hr at 06/28/16 0900  . clevidipine Stopped (06/26/16 4076)  . fentaNYL infusion INTRAVENOUS 75 mcg/hr (06/28/16 0925)  . propofol (DIPRIVAN) infusion 25 mcg/kg/min (06/28/16 0925)   PRN Meds:.sodium chloride, acetaminophen **OR** acetaminophen, alum & mag hydroxide-simeth, docusate, fentaNYL, labetalol, ondansetron, phenol, polyvinyl alcohol Medications Prior to Admission:  Prior to Admission medications   Medication Sig Start Date End Date Taking? Authorizing Provider  ALPRAZolam Duanne Moron) 1 MG tablet Take 1 mg by mouth 3 (three) times daily as needed. For anxiety   Yes Historical Provider, MD  gabapentin (NEURONTIN) 600 MG tablet Take 600-1,200 mg by mouth See admin instructions. Take two tablets in the morning, one at noon, and two tablets at bedtime   Yes Historical Provider, MD  nortriptyline (PAMELOR) 50 MG capsule Take 50 mg by mouth at bedtime.   Yes Historical Provider, MD  oxyCODONE-acetaminophen (PERCOCET) 10-325 MG per tablet Take 1 tablet by mouth 4 (four) times daily as needed. For pain   Yes  Historical Provider, MD  Rivaroxaban 15 & 20 MG TBPK Take as directed on package: Start with one 42m tablet by mouth twice a day with food. On Day 22, switch to one 238mtablet once a day with food. 06/19/16  Yes DoVeryl SpeakMD  traMADol (ULTRAM) 50 MG tablet Take 50-100 mg by mouth every 6 (six) hours as needed for moderate pain. For pain     Historical Provider, MD   Allergies  Allergen Reactions  . Sulfonamide Derivatives Nausea And Vomiting  . Penicillins Nausea And Vomiting    Childhood. Has patient had a PCN reaction causing immediate rash, facial/tongue/throat swelling, SOB or lightheadedness with hypotension: unknown Has patient had a PCN reaction causing  severe rash involving mucus membranes or skin necrosis: unknown Has patient had a PCN reaction that required hospitalization: unknown Has patient had a PCN reaction occurring within the last 10 years: unknown If all of the above answers are "NO", then may proceed with Cephalosporin use.    Review of Systems  Unable to perform ROS: Intubated  All other systems reviewed and are negative.   Physical Exam  Constitutional: She appears well-developed.  HENT:  Head: Normocephalic and atraumatic.  Cardiovascular: Normal rate.   Pulmonary/Chest:  Ventilated. Thus far unable to wean  Genitourinary:  Genitourinary Comments: foley  Musculoskeletal:  On propofol. Not localizing to pain. No spontaneous movement to extremities  Neurological:  Not localizing to pain. Was localizing to pain with LUE 3/1  Skin:  Cool, no mottling noted  Psychiatric:  Unable to test. On propofol. Unresponsive  Nursing note and vitals reviewed.   Vital Signs: BP (!) 177/95 Comment: 20 mg labetalol given  Pulse 91   Temp 98 F (36.7 C) (Oral)   Resp 20   Ht 5' 5"  (1.651 m)   Wt 57.8 kg (127 lb 6.8 oz)   SpO2 91%   BMI 21.20 kg/m  Pain Assessment: CPOT   Pain Score: 3    SpO2: SpO2: 91 % O2 Device:SpO2: 91 % O2 Flow Rate: .O2 Flow Rate  (L/min): 2 L/min  IO: Intake/output summary:  Intake/Output Summary (Last 24 hours) at 06/28/16 1008 Last data filed at 06/28/16 0700  Gross per 24 hour  Intake             4149 ml  Output             1139 ml  Net             3010 ml    LBM: Last BM Date:  (PTA) Baseline Weight: Weight: 55.8 kg (123 lb) Most recent weight: Weight: 57.8 kg (127 lb 6.8 oz)     Palliative Assessment/Data:   Flowsheet Rows   Flowsheet Row Most Recent Value  Intake Tab  Referral Department  Surgery  Unit at Time of Referral  ICU  Palliative Care Primary Diagnosis  Other (Comment)  Date Notified  06/28/16  Palliative Care Type  New Palliative care  Reason for referral  End of Life Care Assistance, Clarify Goals of Care  Date of Admission  06/18/2016  Date first seen by Palliative Care  06/28/16  # of days Palliative referral response time  0 Day(s)  # of days IP prior to Palliative referral  3  Clinical Assessment  Palliative Performance Scale Score  20%  Pain Max last 24 hours  Other (Comment)  Pain Min Last 24 hours  Other (Comment)  Dyspnea Max Last 24 Hours  Other (Comment)  Dyspnea Min Last 24 hours  Other (Comment)  Nausea Max Last 24 Hours  Other (Comment)  Nausea Min Last 24 Hours  Other (Comment)  Anxiety Max Last 24 Hours  Other (Comment)  Anxiety Min Last 24 Hours  Other (Comment)  Other Max Last 24 Hours  Other (Comment)  Psychosocial & Spiritual Assessment  Palliative Care Outcomes  Patient/Family meeting held?  Yes  Who was at the meeting?  sons Christina Barnett,  Palliative Care follow-up planned  Yes, Facility      Time In: 1140 Time Out: 1305 Time Total: 85 min Greater than 50%  of this time was spent counseling and coordinating care related to the above assessment and plan. Discussed with Dr. Christella Noa  Signed by: Dory Horn, NP   Please contact Palliative Medicine Team phone at [phone] for questions and concerns.  For individual provider: See  Shea Evans

## 2016-06-29 ENCOUNTER — Inpatient Hospital Stay (HOSPITAL_COMMUNITY): Payer: Medicare Other

## 2016-06-29 ENCOUNTER — Encounter (HOSPITAL_COMMUNITY): Payer: Self-pay

## 2016-06-29 DIAGNOSIS — I82431 Acute embolism and thrombosis of right popliteal vein: Secondary | ICD-10-CM

## 2016-06-29 DIAGNOSIS — I4891 Unspecified atrial fibrillation: Secondary | ICD-10-CM

## 2016-06-29 LAB — GLUCOSE, CAPILLARY
Glucose-Capillary: 115 mg/dL — ABNORMAL HIGH (ref 65–99)
Glucose-Capillary: 120 mg/dL — ABNORMAL HIGH (ref 65–99)
Glucose-Capillary: 133 mg/dL — ABNORMAL HIGH (ref 65–99)
Glucose-Capillary: 135 mg/dL — ABNORMAL HIGH (ref 65–99)
Glucose-Capillary: 136 mg/dL — ABNORMAL HIGH (ref 65–99)
Glucose-Capillary: 137 mg/dL — ABNORMAL HIGH (ref 65–99)

## 2016-06-29 LAB — CBC WITH DIFFERENTIAL/PLATELET
BASOS PCT: 0 %
Basophils Absolute: 0 10*3/uL (ref 0.0–0.1)
EOS ABS: 0 10*3/uL (ref 0.0–0.7)
EOS PCT: 0 %
HEMATOCRIT: 36.5 % (ref 36.0–46.0)
HEMOGLOBIN: 11.9 g/dL — AB (ref 12.0–15.0)
LYMPHS PCT: 4 %
Lymphs Abs: 1.1 10*3/uL (ref 0.7–4.0)
MCH: 30.1 pg (ref 26.0–34.0)
MCHC: 32.6 g/dL (ref 30.0–36.0)
MCV: 92.4 fL (ref 78.0–100.0)
MONOS PCT: 7 %
Monocytes Absolute: 2 10*3/uL — ABNORMAL HIGH (ref 0.1–1.0)
NEUTROS ABS: 25.3 10*3/uL — AB (ref 1.7–7.7)
NEUTROS PCT: 89 %
Platelets: 358 10*3/uL (ref 150–400)
RBC: 3.95 MIL/uL (ref 3.87–5.11)
RDW: 13.5 % (ref 11.5–15.5)
WBC: 28.4 10*3/uL — ABNORMAL HIGH (ref 4.0–10.5)

## 2016-06-29 LAB — RENAL FUNCTION PANEL
ALBUMIN: 2.1 g/dL — AB (ref 3.5–5.0)
Anion gap: 10 (ref 5–15)
BUN: 11 mg/dL (ref 6–20)
CHLORIDE: 102 mmol/L (ref 101–111)
CO2: 27 mmol/L (ref 22–32)
Calcium: 8.6 mg/dL — ABNORMAL LOW (ref 8.9–10.3)
Creatinine, Ser: 0.5 mg/dL (ref 0.44–1.00)
GFR calc Af Amer: >60 mL/min (ref >60)
GLUCOSE: 139 mg/dL — AB (ref 65–99)
Phosphorus: 3.1 mg/dL (ref 2.5–4.6)
Potassium: 2.8 mmol/L — ABNORMAL LOW (ref 3.5–5.1)
Sodium: 139 mmol/L (ref 135–145)

## 2016-06-29 LAB — MAGNESIUM: MAGNESIUM: 1.7 mg/dL (ref 1.7–2.4)

## 2016-06-29 LAB — PHOSPHORUS: Phosphorus: 3.1 mg/dL (ref 2.5–4.6)

## 2016-06-29 MED ORDER — POTASSIUM CHLORIDE 20 MEQ/15ML (10%) PO SOLN
40.0000 meq | ORAL | Status: AC
  Administered 2016-06-29 (×2): 40 meq
  Filled 2016-06-29 (×2): qty 30

## 2016-06-29 MED ORDER — AMIODARONE HCL IN DEXTROSE 360-4.14 MG/200ML-% IV SOLN
30.0000 mg/h | INTRAVENOUS | Status: DC
  Administered 2016-06-29 – 2016-06-30 (×2): 30 mg/h via INTRAVENOUS
  Filled 2016-06-29: qty 200

## 2016-06-29 MED ORDER — MAGNESIUM SULFATE 4 GM/100ML IV SOLN
4.0000 g | Freq: Once | INTRAVENOUS | Status: AC
Start: 2016-06-29 — End: 2016-06-29
  Administered 2016-06-29: 4 g via INTRAVENOUS
  Filled 2016-06-29: qty 100

## 2016-06-29 MED ORDER — ARTIFICIAL TEARS OP OINT
TOPICAL_OINTMENT | Freq: Three times a day (TID) | OPHTHALMIC | Status: DC
  Administered 2016-06-29 – 2016-06-30 (×2): 1 via OPHTHALMIC
  Filled 2016-06-29: qty 3.5

## 2016-06-29 MED ORDER — AMIODARONE HCL IN DEXTROSE 360-4.14 MG/200ML-% IV SOLN
60.0000 mg/h | INTRAVENOUS | Status: AC
  Administered 2016-06-29 (×2): 60 mg/h via INTRAVENOUS
  Filled 2016-06-29 (×2): qty 200

## 2016-06-29 NOTE — Progress Notes (Signed)
A fib rvr despite cleviprex  Start amio gtt  Dr. Kalman ShanMurali Tekila Caillouet, M.D., [facility].C.P Pulmonary and Critical Care Medicine Staff Physician [place] [facility] [place] [facility] Critical [facility] Pager: [phone], If no answer or between  15:00h - 7:00h: call [phone]  06/29/2016 2:38 PM

## 2016-06-29 NOTE — Progress Notes (Signed)
Patient went into A-fib RVR at 2104. eLink notified, order to give metoprolol. Gave one push with no relief, verbal order to give a second 20 min later. Patient still in afib RVR. Family notified around 2200 since patient is a DNR, elink doctor said there was nothing else to be done. Family said they would be here sometime tonight. Patient converted back into sinus tach around 0002. Will continue to monitor.

## 2016-06-29 NOTE — Progress Notes (Signed)
Patient ID: Christina Barnett, female   DOB: 06/19/1940, 76 y.o.   MRN: [id number] BP 124/79   Pulse (!) 102   Temp 99.8 F (37.7 C) (Axillary)   Resp 16   Ht 5\' 5"  (1.651 m)   Wt 56.6 kg (124 lb 12.5 oz)   SpO2 100%   BMI 20.76 kg/m  Not following commands ventric is draining well Will check head ct

## 2016-06-29 NOTE — Progress Notes (Signed)
PULMONARY / CRITICAL CARE MEDICINE   Name: Jolin Benavides MRN: [id number] DOB: Dec 19, 1940    ADMISSION DATE:  2016-07-22 CONSULTATION DATE:  July 22, 2016  REFERRING MD:  Dr. Myra Gianotti  CHIEF COMPLAINT:  ICH  SUBJECTIVE:  Ventric drain without any output overnight.  Repeat head CT overnight stable; however, this AM appears to have enlarged ventricles (official read still pending).  Exam also worse and pt now essentially unresponsive (only withdraws very minimally in RUE). + 6L since admit.   STUDIES:  Venous doppler 2/21 > large volume occlusive DVT in the left lower extremity. Clot extends from the posterior tibial vein to the distal femoral vein. Ct head 2/27 > market acute hydrocephalus and extensive intraventricular hemorrhage. Once and medial left thalamic hemorrhage. Left frontal hemorrhage approximately 6 mL volume. CT head 02/28 > shunt in place in right ventricle.  Ventricles slightly smaller but overall increase in hemorrhage in left frontal lobe. Slight MLS to the right. CT head 03/01 > stable right frontal ventric catheter.  Similar hemorrhage in left basal ganglia and left frontal lobe. CT head 03/02 >     SIGNIFICANT EVENTS: 2/21 - presented to emergency department at [facility] and diagnosed with occlusive left lower show any DVT 2/27 - to OR for mechanical thrombectomy, suffer pulmonary embolism during the procedure was treated with TPA. Then suffered ICH.  LINES/TUBES: Endotracheal tube 2/27 >   SUBJECTIVE/OVERNIGHT/INTERVAL HX 3/3 - going for HEad cT. Having issues with tachycardia   VITAL SIGNS: BP (!) 141/75   Pulse (!) 105   Temp (!) 101.5 F (38.6 C) (Axillary)   Resp (!) 22   Ht 5\' 5"  (1.651 m)   Wt 56.6 kg (124 lb 12.5 oz)   SpO2 93%   BMI 20.76 kg/m   HEMODYNAMICS:    VENTILATOR SETTINGS: Vent Mode: CPAP;PSV FiO2 (%):  [40 %] 40 % Set Rate:  [15 bmp] 15 bmp Vt Set:  [460 mL] 460 mL PEEP:  [5 cmH20] 5 cmH20 Pressure Support:  [5 cmH20] 5  cmH20 Plateau Pressure:  [8 cmH20-14 cmH20] 14 cmH20  INTAKE / OUTPUT: I/O last 3 completed shifts: In: 6432.5 [I.V.:4712.5; NG/GT:1520; IV Piggyback:200] Out: 5299 [Urine:5160; Drains:139]  PHYSICAL EXAMINATION:  General Appearance:    Looks criticall ill. FRAIL  Head:    Normocephalic, without obvious abnormality, atraumatic  Eyes:    PERRL - yes, conjunctiva/corneas - clear      Ears:    Normal external ear canals, both ears  Nose:   NG tube - no  Throat:  ETT TUBE - yes , OG tube - yes  Neck:   Supple,  No enlargement/tenderness/nodules     Lungs:     Clear to auscultation bilaterally, Ventilator   Synchrony - yes  Chest wall:    No deformity  Heart:    S1 and S2 normal, no murmur, CVP - na.  Pressors - na  Abdomen:     Soft, no masses, no organomegaly  Genitalia:    Not done  Rectal:   not done  Extremities:   Extremities- no cyanosis     Skin:   Intact in exposed areas .      Neurologic:   unresponsive       LABS: PULMONARY  Recent Labs Lab July 22, 2016 0959 07/22/16 2100  PHART  --  7.322*  PCO2ART  --  54.6*  PO2ART  --  397*  HCO3  --  27.4  TCO2 29  --   O2SAT  --  99.4    CBC  Recent Labs Lab 06/27/16 0422 06/28/16 0550 06/29/16 0253  HGB 11.7* 11.3* 11.9*  HCT 36.9 35.5* 36.5  WBC 14.2* 21.7* 28.4*  PLT 418* 386 358    COAGULATION  Recent Labs Lab 11-21-16 1502 11-21-16 1833  INR 1.30 1.19    CARDIAC  No results for input(s): TROPONINI in the last 168 hours. No results for input(s): PROBNP in the last 168 hours.   CHEMISTRY  Recent Labs Lab 11-21-16 1833 06/26/16 0314  06/26/16 1654 06/27/16 0422 06/27/16 1659 06/28/16 0550 06/29/16 0253  NA 136 140  --   --  139  --  140 139  K 4.1 4.0  --   --  3.4*  --  3.5 2.8*  CL 104 107  --   --  106  --  109 102  CO2 23 26  --   --  26  --  23 27  GLUCOSE 115* 101*  --   --  162*  --  150* 139*  BUN 6 5*  --   --  10  --  7 11  CREATININE 0.49 0.40*  --   --  0.42*  --  0.38*  0.50  CALCIUM 8.9 9.1  --   --  8.8*  --  8.6* 8.6*  MG  --   --   < > 2.1 2.0 2.1 1.8 1.7  PHOS  --   --   < > 3.1 2.9  3.0 3.2 2.4* 3.1  3.1  < > = values in this interval not displayed. Estimated Creatinine Clearance: 53.5 mL/min (by C-G formula based on SCr of 0.5 mg/dL).   LIVER  Recent Labs Lab 11-21-16 1502 11-21-16 1833 06/27/16 0422 06/28/16 0550 06/29/16 0253  ALBUMIN  --   --  2.6* 2.2* 2.1*  INR 1.30 1.19  --   --   --      INFECTIOUS No results for input(s): LATICACIDVEN, PROCALCITON in the last 168 hours.   ENDOCRINE CBG (last 3)   Recent Labs  06/29/16 0329 06/29/16 0744 06/29/16 1134  GLUCAP 115* 137* 136*         IMAGING x48h  - image(s) personally visualized  -   highlighted in bold Ct Head Wo Contrast  Result Date: 06/28/2016 CLINICAL DATA:  Hydrocephalus.  Worsening mental status. EXAM: CT HEAD WITHOUT CONTRAST TECHNIQUE: Contiguous axial images were obtained from the base of the skull through the vertex without intravenous contrast. COMPARISON:  06/27/2016 and multiple other previous. FINDINGS: Brain: Marked dilatation of the lateral ventricles since yesterday's study suggesting ventriculostomy malfunction. Blood continues to layer in the occipital horns of lateral ventricles and be present within the fourth and third ventricles. Intraparenchymal hemorrhage emanating from the left thalamus has not changed. Left frontal intraparenchymal hemorrhage has not increased, maximal diameter again approximately 3 cm. Right frontal ventriculostomy with surrounding edema enters the right lateral ventricle but has its tip either within the third ventricular hematoma or the medial right thalamus. Vascular: There is atherosclerotic calcification of the major vessels at the base of the brain. Skull: Negative Sinuses/Orbits: Some layering fluid in the right maxillary sinus. Others clear. Orbits negative. Other: None IMPRESSION: Evidence of ventriculostomy malfunction  with hydrocephalus of the lateral ventricles. No evidence of increased intracranial bleeding. Intraventricular blood, left thalamic blood and left frontal blood appear the same. Ventriculostomy tip could be within the third ventricular hematoma or medial right thalamus. These results were called by telephone at the  time of interpretation on 06/28/2016 at 8:39 am to Dr. Maeola Harman , who verbally acknowledged these results. Electronically Signed   By: Paulina Fusi M.D.   On: 06/28/2016 08:39   Ct Head Wo Contrast  Result Date: 06/27/2016 CLINICAL DATA:  No output of intraventricular drain. Follow-up intracranial hemorrhage. EXAM: CT HEAD WITHOUT CONTRAST TECHNIQUE: Contiguous axial images were obtained from the base of the skull through the vertex without intravenous contrast. COMPARISON:  CT HEAD February 24, 2016 FINDINGS: BRAIN: Ventriculostomy catheter via RIGHT frontal approach, traversing the RIGHT lateral ventricle, distal tip within medial basal ganglia versus third ventricle. Similar interstitial edema about the catheter tract and small hemorrhage. Resolved lateral ventricle dilatation. Third ventricle dilated to 2.2 cm with blood products extending through the cerebral aqua duct to fourth ventricle. Residual dependent blood products in the occipital horns, decreased. Two evolving LEFT frontal lobe intraparenchymal hematomas dominant component is 3 x 1.6 cm, relatively unchanged. Stable subcentimeter LEFT basal ganglia hemorrhage. Surrounding low-density vasogenic edema. Stable 4 mm LEFT-to-RIGHT midline shift. Patchy additional white matter hypodensities most compatible with chronic small vessel ischemic disease. Small amount of redistributed subarachnoid hemorrhage. Basal cisterns are patent. VASCULAR: Moderate calcific atherosclerosis of the carotid siphons. RIGHT frontal burr hole. SKULL: No skull fracture. No significant scalp soft tissue swelling. SINUSES/ORBITS: RIGHT maxillary sinus air-fluid level  with life-support lines in place. The included ocular globes and orbital contents are non-suspicious. OTHER: None. IMPRESSION: Stable RIGHT frontal ventriculostomy catheter ; resolved lateral ventricle dilatation and decreased intraventricular blood products. However, similar distended third ventricle with blood products. Evolving LEFT frontal and LEFT basal ganglia intraparenchymal hematomas. Similar 4 mm LEFT-to-RIGHT midline shift. Small amount of redistributed subarachnoid hemorrhage. Electronically Signed   By: Awilda Metro M.D.   On: 06/27/2016 22:56   Dg Chest Port 1 View  Result Date: 06/29/2016 CLINICAL DATA:  Respiratory failure EXAM: PORTABLE CHEST 1 VIEW COMPARISON:  Chest radiograph from one day prior. FINDINGS: Endotracheal tube tip is 6.1 cm above the carina. Enteric tube enters stomach with the tip not seen on this image. Stable cardiomediastinal silhouette with normal heart size and aortic atherosclerosis. No pneumothorax. Small left pleural effusion appears slightly increased. No significant right pleural effusion. Patchy consolidation at the left greater than right lung bases appears increased. IMPRESSION: 1. Support structures as described. 2. Patchy bibasilar lung consolidation, left greater than right, increased, worrisome for multifocal pneumonia . 3. Small left pleural effusion, slightly increased. Electronically Signed   By: Delbert Phenix M.D.   On: 06/29/2016 12:03   Dg Chest Port 1 View  Result Date: 06/28/2016 CLINICAL DATA:  Hypoxia EXAM: PORTABLE CHEST 1 VIEW COMPARISON:  June 25, 2016 FINDINGS: Endotracheal tube tip is 5.7 cm above carina. Nasogastric tube tip and side port below the diaphragm with the side port seen in the stomach. No pneumothorax. There is new airspace consolidation in the left lower lobe with volume loss on the left. There is a small left pleural effusion. Right lung is mildly hyperexpanded but clear. Heart size is normal. Pulmonary vascularity is  normal. No adenopathy. There is atherosclerotic calcification in the aorta. No bone lesions. IMPRESSION: Tube positions as described without pneumothorax. Airspace consolidation in the left lower lobe with volume loss and small left pleural effusion. Question pneumonia versus aspiration as most likely etiologies given this appearance. Right lungs somewhat hyperexpanded but clear. Stable cardiac silhouette. There is aortic atherosclerosis. Electronically Signed   By: Bretta Bang III M.D.   On: 06/28/2016 07:21  DISCUSSION: 76 year old female with recently diagnosed DVT presented for mechanical thrombectomy.   ASSESSMENT / PLAN:  PULMONARY A: Acute hypoxemic respiratory failure secondary to pulmonary embolism  Inability to protect airway secondary to decreased hemorrhage   3/3 - does not meet sbt criteria  P:   Mechanical ventilatory support. Ventilator associated pneumonia prevention protocol. Daily SBT's - unable this AM given poor neuro exam. Follow CXR.  CARDIOVASCULAR A:  Hypertension - resolved.  3/3-  Having tachycardia P:  Telemetry monitoring. Labetalol PRN. Lopressor PRN  RENAL A:   Hypokalemia and hypomag  P:   Monitiorr urine output. K and mag replateion  GASTROINTESTINAL A:   No acute issues P:   NPO. Protonix for SUP. Continue TF's.  HEMATOLOGIC A:   Coagulopathy secondary to xarelto, tpa, and heparin administration, now with ICH - s/p Kcentra, protamine, cryo. Concern for pulmonary Embolism LLE occlusive DVT s/p mechanical thrombectomy P:  Trend CBC. Trend INR. Will have to forego DVT/PE anticoagulation in setting ICH - needs IVC filter. Vascular following.  INFECTIOUS A:   No acute issues. P:   Follow WBC and fever curve.  ENDOCRINE A:   Hyperglycemia. P:   SSI.  NEUROLOGIC A:   Intracranial hemorrhage following TPA administration for presumed PE - had IVC drain in place but stopped draining PM 03/01.  New head CT  03/02 appears to have enlarged ventricles but official read still pending.  3/3 - nsgy getting ct head P:   RASS goal: 0 to -1 Propofol and fentanyl infusion  Daily WUA Ct head per nsgy   FAMILY  - Updates: Son Aurther Loft updated 02/28.  - Inter-disciplinary family meet or Palliative Care meeting due by: 3/3    The patient is critically ill with multiple organ systems failure and requires high complexity decision making for assessment and support, frequent evaluation and titration of therapies, application of advanced monitoring technologies and extensive interpretation of multiple databases.   Critical Care Time devoted to patient care services described in this note is  30  Minutes. This time reflects time of care of this signee Dr Kalman Shan. This critical care time does not reflect procedure time, or teaching time or supervisory time of PA/NP/Med student/Med Resident etc but could involve care discussion time    Dr. Kalman Shan, M.D., [facility].C.P Pulmonary and Critical Care Medicine Staff Physician [place] [facility] [place] Pulmonary and Critical Care Pager: [phone], If no answer or between  15:00h - 7:00h: call [phone]  06/29/2016 1:40 PM

## 2016-06-29 NOTE — Progress Notes (Signed)
[facility]  Subjective  - non responsive on fentanyl diprivan   Objective 114/83 (!) 152 98.4 F (36.9 C) (Axillary) 19 97%  Intake/Output Summary (Last 24 hours) at 06/29/16 0716 Last data filed at 06/29/16 0600  Gross per 24 hour  Intake          3872.06 ml  Output             3978 ml  Net          -105.94 ml   Not responsive to stimuli Left leg slight more swollen than right   Assessment/Planning: S/p thrombolysis for DVT with intracranial bleed.  Not really responsive currently on my first encounter with pt but also from nursing staff overnight.  Ventric seems to be draining CSF at this point.  Atrial cardiac arrhthymia overnight with burst of afib.  HR 150s this morning atrial tachy/afib  Potentially IVC filter today depending on direction family wishes to proceed  Fabienne BrunsFields, Charles 06/29/2016 7:16 AM --  Laboratory Lab Results:  Recent Labs  06/28/16 0550 06/29/16 0253  WBC 21.7* 28.4*  HGB 11.3* 11.9*  HCT 35.5* 36.5  PLT 386 358   BMET  Recent Labs  06/28/16 0550 06/29/16 0253  NA 140 139  K 3.5 2.8*  CL 109 102  CO2 23 27  GLUCOSE 150* 139*  BUN 7 11  CREATININE 0.38* 0.50  CALCIUM 8.6* 8.6*    COAG Lab Results  Component Value Date   INR 1.19 10-14-2016   INR 1.30 10-14-2016   No results found for: PTT

## 2016-06-29 NOTE — Progress Notes (Signed)
Patient ID: Christina SpellCarolyn Olmsted, female   DOB: 1941-02-11, 76 y.o.   MRN: [id number] Head ct shows slight decrease in ventricular size. ventric in good position.

## 2016-06-29 NOTE — Progress Notes (Addendum)
Daily Progress Note   Patient Name: Christina Barnett       Date: 06/29/2016 DOB: 01-24-1941  Age: 76 y.o. MRN#: [id number] Attending Physician: Serafina Mitchell, MD Primary Care Physician: Celedonio Savage, MD Admit Date: 06/12/2016  Reason for Consultation/Follow-up: Establishing goals of care, Non pain symptom management, Pain control, Psychosocial/spiritual support and Terminal Care  Subjective: Pt has new shunt that is draining but unfortunately she is not more alert. Still not localizing to pain. Remains on sedation. Weaning trial this am  Length of Stay: 4  Current Medications: Scheduled Meds:  . chlorhexidine gluconate (MEDLINE KIT)  15 mL Mouth Rinse BID  . Chlorhexidine Gluconate Cloth  6 each Topical Q0600  . docusate sodium  100 mg Oral Daily  . famotidine (PEPCID) IV  20 mg Intravenous Q12H  . feeding supplement (VITAL HIGH PROTEIN)  1,000 mL Per Tube Q24H  . fentaNYL (SUBLIMAZE) injection  50 mcg Intravenous Once  . insulin aspart  0-15 Units Subcutaneous Q4H  . mouth rinse  15 mL Mouth Rinse Q2H  . mupirocin ointment  1 application Nasal BID    Continuous Infusions: . sodium chloride 100 mL/hr at 06/29/16 1056  . clevidipine 1 mg/hr (06/29/16 1137)  . fentaNYL infusion INTRAVENOUS 50 mcg/hr (06/29/16 1147)  . propofol (DIPRIVAN) infusion 25 mcg/kg/min (06/29/16 1147)    PRN Meds: sodium chloride, acetaminophen **OR** acetaminophen, alum & mag hydroxide-simeth, docusate, fentaNYL, labetalol, metoprolol, ondansetron, phenol, polyvinyl alcohol  Physical Exam  Constitutional:  Critically ill female. Unresponsive to voice and touch  HENT:  Shunt top of head  Cardiovascular: Normal rate.   Pulmonary/Chest:  On vent Weaning presently  Genitourinary:  Genitourinary  Comments: foley  Musculoskeletal:  No spontaneous movment  Neurological:  On sedation. Does not localize to pain  Skin:  cool  Psychiatric:  Unable to test  Nursing note and vitals reviewed.           Vital Signs: BP (!) 142/82   Pulse (!) 106   Temp (!) 101.5 F (38.6 C) (Axillary)   Resp (!) 22   Ht _0  (1.651 m)   Wt 56.6 kg (124 lb 12.5 oz)   SpO2 93%   BMI 20.76 kg/m  SpO2: SpO2: 93 % O2 Device: O2 Device: Ventilator O2 Flow Rate: O2 Flow Rate (L/min): 2 L/min  Intake/output summary:  Intake/Output Summary (Last 24 hours) at 06/29/16 1302 Last data filed at 06/29/16 1200  Gross per 24 hour  Intake          4494.49 ml  Output             2856 ml  Net          1638.49 ml   LBM: Last BM Date:  (pta) Baseline Weight: Weight: 55.8 kg (123 lb) Most recent weight: Weight: 56.6 kg (124 lb 12.5 oz)       Palliative Assessment/Data:    Flowsheet Rows   Flowsheet Row Most Recent Value  Intake Tab  Referral Department  Surgery  Unit at Time of Referral  ICU  Palliative Care Primary Diagnosis  Other (Comment)  Date Notified  06/28/16  Palliative Care Type  New Palliative care  Reason for referral  End of Life Care Assistance, Clarify Goals of Care  Date of Admission  06/03/2016  Date first seen by Palliative Care  06/28/16  # of days Palliative referral response time  0 Day(s)  # of days IP prior to Palliative referral  3  Clinical Assessment  Palliative Performance Scale Score  20%  Pain Max last 24 hours  Other (Comment)  Pain Min Last 24 hours  Other (Comment)  Dyspnea Max Last 24 Hours  Other (Comment)  Dyspnea Min Last 24 hours  Other (Comment)  Nausea Max Last 24 Hours  Other (Comment)  Nausea Min Last 24 Hours  Other (Comment)  Anxiety Max Last 24 Hours  Other (Comment)  Anxiety Min Last 24 Hours  Other (Comment)  Other Max Last 24 Hours  Other (Comment)  Psychosocial & Spiritual Assessment  Palliative Care Outcomes  Patient/Family meeting held?   Yes  Who was at the meeting?  sons kelly,  Palliative Care follow-up planned  Yes, Facility      Patient Active Problem List   Diagnosis Date Noted  . Respiratory failure ([place])   . Palliative care encounter   . Protein-calorie malnutrition, severe 06/26/2016  . ICH (intracerebral hemorrhage) ([place]) 06/01/2016  . DVT, popliteal, acute ([place]) 06/13/2016  . Olecranon bursitis 10/09/2011  . MONONEURITIS OF UNSPECIFIED SITE 01/08/2010  . DEGENERATIVE JOINT DISEASE, LEFT KNEE 12/11/2009    Palliative Care Assessment & Plan   Patient Profile: 76 y.o. female  with past medical history of Shortness of breath, anxiety, chronic back pain, admitted on 06/03/2016 with right lower extremity DVT. Patient reports that DVT pain and swelling had gotten worse over the previous 10 days. She denied any history of trauma. Ultrasound was positive for occlusive thrombus within the popliteal vein. She underwent a thrombectomy of the right lower extremity and ultimately a right frontal ventricular ostomy, IVC placement. Patient has taken a turn for the worse overnight. CT scan performed 06-28-2016 show evidence of ventriculostomy malfunction. Patient is not localizing to pain as she was yesterday. Per nursing she responded to pain with her left upper extremity but she is not reacting today..    Recommendations/Plan:  Pt now DNR  If she self extubates do not reintubate  Family shows good insight into gravity of disease but needing time to " give her a chance"  Cont supportive care, abx, IVF, RX for comfort  No trach or PEG  Anticipate that family will be able to make decision to extubate first of next week  Goals of Care and Additional Recommendations:  Limitations on Scope of Treatment: No Artificial Feeding, No Chemotherapy, No  Hemodialysis, No Radiation, No Surgical Procedures and No Tracheostomy  Code Status:    Code Status Orders        Start     Ordered   06/28/16 1512  Do not attempt  resuscitation (DNR)  Continuous    Question Answer Comment  In the event of cardiac or respiratory ARREST Do not call a "code blue"   In the event of cardiac or respiratory ARREST Do not perform Intubation, CPR, defibrillation or ACLS   In the event of cardiac or respiratory ARREST Use medication by any route, position, wound care, and other measures to relive pain and suffering. May use oxygen, suction and manual treatment of airway obstruction as needed for comfort.      06/28/16 1511    Code Status History    Date Active Date Inactive Code Status Order ID Comments User Context   06/23/2016  3:29 PM 06/28/2016  3:11 PM Full Code [id number]  Serafina Mitchell, MD Inpatient       Prognosis:   < 4 weeks in the setting of anoxic brain injury  Discharge Planning:  To Be Determined Thank you for allowing the Palliative Medicine Team to assist in the care of this patient.   Time In: 0830 Time Out: 0900 Total Time 30 Prolonged Time Billed  no       Greater than 50%  of this time was spent counseling and coordinating care related to the above assessment and plan.  **Spoke to patient's son Claiborne Billings later in the afternoon and updated him on recent CAT scan as well as his mother's clinical condition. I did attempt to prepare him that probably would be looking at a one way extubation the first of the week given her lack of improvement despite shunt replacement. He informs me that he and his family had a warty spoken in those terms and are trying to prepare their father.  We'll follow-up with patient and family in the morning. Per son, patient's daughter and husband arriving to the hospital "midmorning".  Dory Horn, NP  Please contact Palliative Medicine Team phone at [phone] for questions and concerns.

## 2016-06-29 NOTE — Progress Notes (Signed)
RT transported patient to CT and returned to 3M12 without complications. Vital signs stable at this time. Patient tolerated well. RN at bedside. RT will continue to monitor.

## 2016-06-29 NOTE — Progress Notes (Signed)
Pt converted back into Afib w/ RVR, given PRN lopressor IVP w/o a decrease in HR. After 8 minutes sustaining in 150's - notified CCM via eLink to inform of current pt situation. MD informed that if pt maintains severely tachycardic after 30 minutes - give another 5mg  IVP of lopressor in an attempt to decrease HR.   Will continue to monitor and provide interventions as ordered via MD.   Francia GreavesSavannah R Bowdy Bair, RN

## 2016-06-29 NOTE — Progress Notes (Signed)
Pt remains in Afib w/ RVR after the 30 minute mark. Administered additional PRN lopressor 5mg  dose per MD verbal order (given during previous conversation).  Will continue to monitor.  Francia GreavesSavannah R Long Brimage, RN

## 2016-06-30 DIAGNOSIS — G911 Obstructive hydrocephalus: Secondary | ICD-10-CM

## 2016-06-30 LAB — CBC WITH DIFFERENTIAL/PLATELET
BASOS PCT: 0 %
Basophils Absolute: 0 10*3/uL (ref 0.0–0.1)
EOS ABS: 0 10*3/uL (ref 0.0–0.7)
EOS PCT: 0 %
HCT: 36.4 % (ref 36.0–46.0)
HEMOGLOBIN: 11.7 g/dL — AB (ref 12.0–15.0)
LYMPHS PCT: 2 %
Lymphs Abs: 0.6 10*3/uL — ABNORMAL LOW (ref 0.7–4.0)
MCH: 29.7 pg (ref 26.0–34.0)
MCHC: 32.1 g/dL (ref 30.0–36.0)
MCV: 92.4 fL (ref 78.0–100.0)
Monocytes Absolute: 2 10*3/uL — ABNORMAL HIGH (ref 0.1–1.0)
Monocytes Relative: 7 %
NEUTROS PCT: 91 %
Neutro Abs: 25.5 10*3/uL — ABNORMAL HIGH (ref 1.7–7.7)
Platelets: 399 10*3/uL (ref 150–400)
RBC: 3.94 MIL/uL (ref 3.87–5.11)
RDW: 13.6 % (ref 11.5–15.5)
WBC: 28.1 10*3/uL — ABNORMAL HIGH (ref 4.0–10.5)

## 2016-06-30 LAB — GLUCOSE, CAPILLARY
GLUCOSE-CAPILLARY: 136 mg/dL — AB (ref 65–99)
GLUCOSE-CAPILLARY: 130 mg/dL — AB (ref 65–99)
Glucose-Capillary: 155 mg/dL — ABNORMAL HIGH (ref 65–99)

## 2016-06-30 LAB — RENAL FUNCTION PANEL
ANION GAP: 6 (ref 5–15)
Albumin: 1.9 g/dL — ABNORMAL LOW (ref 3.5–5.0)
BUN: 14 mg/dL (ref 6–20)
CHLORIDE: 110 mmol/L (ref 101–111)
CO2: 26 mmol/L (ref 22–32)
Calcium: 8.4 mg/dL — ABNORMAL LOW (ref 8.9–10.3)
Creatinine, Ser: 0.39 mg/dL — ABNORMAL LOW (ref 0.44–1.00)
GFR calc non Af Amer: >60 mL/min (ref >60)
Glucose, Bld: 151 mg/dL — ABNORMAL HIGH (ref 65–99)
POTASSIUM: 3.3 mmol/L — AB (ref 3.5–5.1)
Phosphorus: 3 mg/dL (ref 2.5–4.6)
Sodium: 142 mmol/L (ref 135–145)

## 2016-06-30 LAB — MAGNESIUM: MAGNESIUM: 2.2 mg/dL (ref 1.7–2.4)

## 2016-06-30 MED ORDER — GLYCOPYRROLATE 0.2 MG/ML IJ SOLN
0.4000 mg | Freq: Three times a day (TID) | INTRAMUSCULAR | Status: DC
  Administered 2016-06-30: 0.4 mg via INTRAVENOUS
  Filled 2016-06-30: qty 2

## 2016-06-30 MED ORDER — MORPHINE BOLUS VIA INFUSION
5.0000 mg | INTRAVENOUS | Status: DC | PRN
  Filled 2016-06-30: qty 20

## 2016-06-30 MED ORDER — SODIUM CHLORIDE 0.9 % IV SOLN
10.0000 mg/h | INTRAVENOUS | Status: DC
  Administered 2016-06-30: 5 mg/h via INTRAVENOUS
  Filled 2016-06-30: qty 10
  Filled 2016-06-30: qty 5

## 2016-06-30 MED ORDER — MIDAZOLAM HCL 5 MG/ML IJ SOLN
10.0000 mg/h | INTRAMUSCULAR | Status: DC
  Administered 2016-06-30: 10 mg/h via INTRAVENOUS
  Filled 2016-06-30: qty 10

## 2016-06-30 MED ORDER — POTASSIUM CHLORIDE 20 MEQ/15ML (10%) PO SOLN: 40.0000 meq | Freq: Once | ORAL | Status: DC

## 2016-06-30 MED ORDER — MIDAZOLAM BOLUS VIA INFUSION (WITHDRAWAL LIFE SUSTAINING TX)
5.0000 mg | INTRAVENOUS | Status: DC | PRN
  Filled 2016-06-30: qty 20

## 2016-06-30 MED ORDER — GLYCOPYRROLATE 0.2 MG/ML IJ SOLN
0.4000 mg | INTRAMUSCULAR | Status: AC
  Administered 2016-06-30: 0.4 mg via INTRAVENOUS
  Filled 2016-06-30: qty 2

## 2016-06-30 MED ORDER — DOCUSATE SODIUM 50 MG/5ML PO LIQD: 100.0000 mg | Freq: Every day | ORAL | Status: DC

## 2016-06-30 MED ORDER — FENTANYL 2500MCG IN NS 250ML (10MCG/ML) PREMIX INFUSION
25.0000 ug/h | INTRAVENOUS | Status: DC
  Administered 2016-06-30: 60 ug/h via INTRAVENOUS
  Filled 2016-06-30: qty 250

## 2016-06-30 MED ORDER — GLYCOPYRROLATE 0.2 MG/ML IJ SOLN: 0.4000 mg | INTRAMUSCULAR | Status: DC | PRN

## 2016-07-02 ENCOUNTER — Encounter (HOSPITAL_COMMUNITY): Payer: Self-pay | Admitting: Surgery

## 2016-07-12 ENCOUNTER — Other Ambulatory Visit: Payer: Self-pay

## 2016-07-28 NOTE — Progress Notes (Signed)
PULMONARY / CRITICAL CARE MEDICINE   Name: Christina Barnett MRN: [id number] DOB: 1940/12/04    ADMISSION DATE:  07/16/2016 CONSULTATION DATE:  07/16/2016  REFERRING MD:  Dr. Myra Gianotti  CHIEF COMPLAINT:  ICH  SUBJECTIVE:  Ventric drain without any output overnight.  Repeat head CT overnight stable; however, this AM appears to have enlarged ventricles (official read still pending).  Exam also worse and pt now essentially unresponsive (only withdraws very minimally in RUE). + 6L since admit.   STUDIES:  Venous doppler 2/21 > large volume occlusive DVT in the left lower extremity. Clot extends from the posterior tibial vein to the distal femoral vein. Ct head 2/27 > market acute hydrocephalus and extensive intraventricular hemorrhage. Once and medial left thalamic hemorrhage. Left frontal hemorrhage approximately 6 mL volume. CT head 02/28 > shunt in place in right ventricle.  Ventricles slightly smaller but overall increase in hemorrhage in left frontal lobe. Slight MLS to the right. CT head 03/01 > stable right frontal ventric catheter.  Similar hemorrhage in left basal ganglia and left frontal lobe.      SIGNIFICANT EVENTS: 2/21 - presented to emergency department at [facility] and diagnosed with occlusive left lower show any DVT 2/27 - to OR for mechanical thrombectomy, suffer pulmonary embolism during the procedure was treated with TPA. Then suffered ICH. INTUBATD 3/3 - Amio gtt for a fib RVR  SUBJECTIVE/OVERNIGHT/INTERVAL HX 07/10/2016 - reamins unresponsieve. Son ready for terminal wean per RN but husband not. Palliative and NSGY talking to them  VITAL SIGNS: BP (!) 116/59   Pulse 84   Temp 99.7 F (37.6 C) (Axillary)   Resp 17   Ht 5\' 5"  (1.651 m)   Wt 60.4 kg (133 lb 2.5 oz)   SpO2 96%   BMI 22.16 kg/m   HEMODYNAMICS:    VENTILATOR SETTINGS: Vent Mode: CPAP;PSV FiO2 (%):  [40 %] 40 % Set Rate:  [15 bmp] 15 bmp Vt Set:  [460 mL] 460 mL PEEP:  [5 cmH20] 5  cmH20 Pressure Support:  [5 cmH20] 5 cmH20 Plateau Pressure:  [11 cmH20-14 cmH20] 11 cmH20  INTAKE / OUTPUT: I/O last 3 completed shifts: In: 5922.2 [I.V.:4142.2; NG/GT:1480; IV Piggyback:300] Out: 2560 [Urine:2240; Drains:320]  PHYSICAL EXAMINATION:    General Appearance:    Looks criticall il and frail  Head:    Normocephalic, without obvious abnormality, atraumatic  Eyes:    PERRL - yes, conjunctiva/corneas - clear      Ears:    Normal external ear canals, both ears  Nose:   NG tube - no  Throat:  ETT TUBE - yes , OG tube - yes  Neck:   Supple,  No enlargement/tenderness/nodules     Lungs:     Clear to auscultation bilaterally, Ventilator   Synchrony - yes  Chest wall:    No deformity  Heart:    S1 and S2 normal, no murmur, CVP - no.  Pressors - no on amio gtt  Abdomen:     Soft, no masses, no organomegaly  Genitalia:    Not done  Rectal:   not done  Extremities:   Extremities- LLE edema +     Skin:   Intact in exposed areas . Sacral area - unknown     Neurologic:   RASS -4         LABS: PULMONARY  Recent Labs Lab Jul 16, 2016 0959 16-Jul-2016 2100  PHART  --  7.322*  PCO2ART  --  54.6*  PO2ART  --  397*  HCO3  --  27.4  TCO2 29  --   O2SAT  --  99.4    CBC  Recent Labs Lab 06/28/16 0550 06/29/16 0253 08/25/16 0332  HGB 11.3* 11.9* 11.7*  HCT 35.5* 36.5 36.4  WBC 21.7* 28.4* 28.1*  PLT 386 358 399    COAGULATION  Recent Labs Lab 06/07/2016 1502 06/23/2016 1833  INR 1.30 1.19    CARDIAC  No results for input(s): TROPONINI in the last 168 hours. No results for input(s): PROBNP in the last 168 hours.   CHEMISTRY  Recent Labs Lab 06/26/16 0314  06/27/16 0422 06/27/16 1659 06/28/16 0550 06/29/16 0253 08/25/16 0332  NA 140  --  139  --  140 139 142  K 4.0  --  3.4*  --  3.5 2.8* 3.3*  CL 107  --  106  --  109 102 110  CO2 26  --  26  --  23 27 26   GLUCOSE 101*  --  162*  --  150* 139* 151*  BUN 5*  --  10  --  7 11 14   CREATININE  0.40*  --  0.42*  --  0.38* 0.50 0.39*  CALCIUM 9.1  --  8.8*  --  8.6* 8.6* 8.4*  MG  --   < > 2.0 2.1 1.8 1.7 2.2  PHOS  --   < > 2.9  3.0 3.2 2.4* 3.1  3.1 3.0  < > = values in this interval not displayed. Estimated Creatinine Clearance: 53.8 mL/min (by C-G formula based on SCr of 0.39 mg/dL (L)).   LIVER  Recent Labs Lab 06/10/2016 1502 06/06/2016 1833 06/27/16 0422 06/28/16 0550 06/29/16 0253 08/25/16 0332  ALBUMIN  --   --  2.6* 2.2* 2.1* 1.9*  INR 1.30 1.19  --   --   --   --      INFECTIOUS No results for input(s): LATICACIDVEN, PROCALCITON in the last 168 hours.   ENDOCRINE CBG (last 3)   Recent Labs  06/29/16 2349 08/25/16 0337 08/25/16 0817  GLUCAP 135* 155* 136*         IMAGING x48h  - image(s) personally visualized  -   highlighted in bold Ct Head Wo Contrast  Result Date: 06/29/2016 CLINICAL DATA:  Acquired hydrocephalus. Repositioned ventriculostomy. EXAM: CT HEAD WITHOUT CONTRAST TECHNIQUE: Contiguous axial images were obtained from the base of the skull through the vertex without intravenous contrast. COMPARISON:  Yesterday FINDINGS: Brain: Interval right ventricular drain revision. The catheter is in similar position of prior, with tip near the foramen of Monro after traversing the body of the right lateral ventricle. Lateral ventricular volume is decreased and normal, best seen at the temporal horns and on coronal reformats. Multifocal intracranial hemorrhage. Left frontal operculum hematomas are similar to prior, largest measuring up to 3 cm. Bilateral thalamus/third ventricular clot is also stable. When allowing for some redistribution in the occipital horns, no change in lateral ventricular hemorrhage. Small volume subarachnoid hemorrhage seen in the sylvian fissures and around the left cerebellum which may be redistribution, stable. There is minimally increased hemorrhage along the ventriculostomy catheter after interval revision. Small stable  subarachnoid hemorrhage in the same region. Low-density in the region of the catheter is stable and may be related to CSF flow. Brain low-density around the left frontal operculum hematoma is stable. No acute infarct. Vascular: Atherosclerotic calcification.  No hyperdense vessel. Skull: Burr-hole is unremarkable. Sinuses/Orbits: Intubation with nasopharyngeal and right maxillary sinus fluid. IMPRESSION: 1.  Decreased ventricular volume after revision.  No hydrocephalus. 2. Multifocal intracranial hemorrhage as described. There is minimally increased clot along the right frontal ventriculostomy catheter. Elsewhere intracranial hemorrhage is stable volume. Electronically Signed   By: Marnee Spring M.D.   On: 06/29/2016 16:08   Dg Chest Port 1 View  Result Date: 06/29/2016 CLINICAL DATA:  Respiratory failure EXAM: PORTABLE CHEST 1 VIEW COMPARISON:  Chest radiograph from one day prior. FINDINGS: Endotracheal tube tip is 6.1 cm above the carina. Enteric tube enters stomach with the tip not seen on this image. Stable cardiomediastinal silhouette with normal heart size and aortic atherosclerosis. No pneumothorax. Small left pleural effusion appears slightly increased. No significant right pleural effusion. Patchy consolidation at the left greater than right lung bases appears increased. IMPRESSION: 1. Support structures as described. 2. Patchy bibasilar lung consolidation, left greater than right, increased, worrisome for multifocal pneumonia . 3. Small left pleural effusion, slightly increased. Electronically Signed   By: Delbert Phenix M.D.   On: 06/29/2016 12:03        DISCUSSION: 76 year old female with recently diagnosed DVT presented for mechanical thrombectomy.   ASSESSMENT and PLAN  Respiratory failure (HCC) Does not meet medical wean/extubation criteria due to mental status  Plan Full vent support; terminal wean appropriate once goals established  ICH (intracerebral hemorrhage) (HCC) Per NSGY  who are advising palliation  DVT, popliteal, acute (HCC) Per VVS - advising palliation  Palliative care encounter Palliative care talking to family. Apparently husband resistant to terminal wean. Will await outcomes  Atrial fibrillation with RVR (HCC) Started amio gtt 06/29/16 with converstion to sinus . Currently in sinus  Plan contnue amio gtt      FAMILY  - Updates: 07/22/2016 --> no family at bedside; they are meeitn with pallicative care  - Inter-disciplinary family meet or Palliative Care meeting due by:  DAy 7. Current LOS is LOS 5 days - ongoing with palliative care  CODE STATUS    Code Status Orders        Start     Ordered   06/28/16 1512  Do not attempt resuscitation (DNR)  Continuous    Question Answer Comment  In the event of cardiac or respiratory ARREST Do not call a "code blue"   In the event of cardiac or respiratory ARREST Do not perform Intubation, CPR, defibrillation or ACLS   In the event of cardiac or respiratory ARREST Use medication by any route, position, wound care, and other measures to relive pain and suffering. May use oxygen, suction and manual treatment of airway obstruction as needed for comfort.      06/28/16 1511    Code Status History    Date Active Date Inactive Code Status Order ID Comments User Context   2016/07/02  3:29 PM 06/28/2016  3:11 PM Full Code [id number]  Nada Libman, MD Inpatient        DISPO Keep in ICU      The patient is critically ill with multiple organ systems failure and requires high complexity decision making for assessment and support, frequent evaluation and titration of therapies, application of advanced monitoring technologies and extensive interpretation of multiple databases.   Critical Care Time devoted to patient care services described in this note is  30  Minutes. This time reflects time of care of this signee Dr Kalman Shan. This critical care time does not reflect procedure time, or teaching  time or supervisory time of PA/NP/Med student/Med Resident etc but could involve  care discussion time    Dr. Kalman Shan, M.D., [facility].C.P Pulmonary and Critical Care Medicine Staff Physician [facility] [place] [facility] Pager: [phone], If no answer or between  15:00h - 7:00h: call [phone]  07/11/2016 12:00 PM

## 2016-07-28 NOTE — Procedures (Signed)
Extubation Procedure Note  Patient Details:   Name: Christina Barnett DOB: 1941/02/13 MRN: [id number]   Airway Documentation:     Evaluation  O2 sats: currently acceptable Complications: No apparent complications Patient did tolerate procedure well. Bilateral Breath Sounds: Clear, Diminished   No   Pt terminally extubated.  Forest BeckerJean S Tranae Laramie 07/02/2016, 3:06 PM

## 2016-07-28 NOTE — Progress Notes (Signed)
Daily Progress Note   Patient Name: Christina Barnett       Date: 05-Jul-2016 DOB: 09-Apr-1941  Age: 76 y.o. MRN#: [id number] Attending Physician: Serafina Mitchell, MD Primary Care Physician: Celedonio Savage, MD Admit Date: 06/26/2016  Reason for Consultation/Follow-up: Psychosocial/spiritual support and Withdrawal of life-sustaining treatment  Subjective: Met with patient's husband, son's, daughter to discuss one way extubation. Family was able to reevaluate that they do not see her getting better and that she would not want to "live any longer like this". Family had spoken with Dr. Cyndy Freeze earlier and show good insight into pertinent medical issues. We reviewed the details of how weak go forward in terms of ensuring Christina Barnett's comfort with use of medications such as morphine and Versed  Length of Stay: 5  Current Medications: Scheduled Meds:  . artificial tears   Both Eyes Q8H  . chlorhexidine gluconate (MEDLINE KIT)  15 mL Mouth Rinse BID  . Chlorhexidine Gluconate Cloth  6 each Topical Q0600  . docusate  100 mg Per Tube Daily  . famotidine (PEPCID) IV  20 mg Intravenous Q12H  . feeding supplement (VITAL HIGH PROTEIN)  1,000 mL Per Tube Q24H  . fentaNYL (SUBLIMAZE) injection  50 mcg Intravenous Once  . insulin aspart  0-15 Units Subcutaneous Q4H  . mouth rinse  15 mL Mouth Rinse Q2H  . mupirocin ointment  1 application Nasal BID  . potassium chloride  40 mEq Per Tube Once    Continuous Infusions: . sodium chloride 100 mL/hr at 2016/07/05 0824  . amiodarone 30 mg/hr (07-05-2016 0514)  . clevidipine Stopped (06/29/16 1443)  . fentaNYL infusion INTRAVENOUS    . propofol (DIPRIVAN) infusion 20 mcg/kg/min (July 05, 2016 1000)    PRN Meds: sodium chloride, acetaminophen **OR** acetaminophen, alum & mag  hydroxide-simeth, docusate, fentaNYL, labetalol, metoprolol, ondansetron, phenol, polyvinyl alcohol  Physical Exam  Constitutional:  Acutely ill appearing female. She is on the ventilator  Neck:  Neck extended  Cardiovascular:  Tachycardic  Pulmonary/Chest:  On ventilator  Genitourinary:  Genitourinary Comments: Foley  Neurological:  Unresponsive. On sedation  Skin:  Cool, ecchymotic  Psychiatric:  Unable to test  Nursing note and vitals reviewed.           Vital Signs: BP (!) 145/85   Pulse 92   Temp (!) 101 F (38.3 C) (  Axillary)   Resp 19   Ht 5' 5"  (1.651 m)   Wt 60.4 kg (133 lb 2.5 oz)   SpO2 94%   BMI 22.16 kg/m  SpO2: SpO2: 94 % O2 Device: O2 Device: Ventilator O2 Flow Rate: O2 Flow Rate (L/min): 2 L/min  Intake/output summary:  Intake/Output Summary (Last 24 hours) at 18-Jul-2016 1252 Last data filed at 2016-07-18 1200  Gross per 24 hour  Intake          4281.48 ml  Output             1715 ml  Net          2566.48 ml   LBM: Last BM Date:  (pta) Baseline Weight: Weight: 55.8 kg (123 lb) Most recent weight: Weight: 60.4 kg (133 lb 2.5 oz)       Palliative Assessment/Data:    Flowsheet Rows   Flowsheet Row Most Recent Value  Intake Tab  Referral Department  Surgery  Unit at Time of Referral  ICU  Palliative Care Primary Diagnosis  Other (Comment)  Date Notified  06/28/16  Palliative Care Type  New Palliative care  Reason for referral  End of Life Care Assistance, Clarify Goals of Care  Date of Admission  06/17/2016  Date first seen by Palliative Care  06/28/16  # of days Palliative referral response time  0 Day(s)  # of days IP prior to Palliative referral  3  Clinical Assessment  Palliative Performance Scale Score  20%  Pain Max last 24 hours  Other (Comment)  Pain Min Last 24 hours  Other (Comment)  Dyspnea Max Last 24 Hours  Other (Comment)  Dyspnea Min Last 24 hours  Other (Comment)  Nausea Max Last 24 Hours  Other (Comment)  Nausea Min Last  24 Hours  Other (Comment)  Anxiety Max Last 24 Hours  Other (Comment)  Anxiety Min Last 24 Hours  Other (Comment)  Other Max Last 24 Hours  Other (Comment)  Psychosocial & Spiritual Assessment  Palliative Care Outcomes  Patient/Family meeting held?  Yes  Who was at the meeting?  sons Christina Barnett,  Palliative Care follow-up planned  Yes, Facility      Patient Active Problem List   Diagnosis Date Noted  . Atrial fibrillation with RVR ([place]) 06/29/2016  . Respiratory failure ([place])   . Palliative care encounter   . Protein-calorie malnutrition, severe 06/26/2016  . ICH (intracerebral hemorrhage) ([place]) 06/09/2016  . DVT, popliteal, acute ([place]) 06/15/2016  . Olecranon bursitis 10/09/2011  . MONONEURITIS OF UNSPECIFIED SITE 01/08/2010  . DEGENERATIVE JOINT DISEASE, LEFT KNEE 12/11/2009    Palliative Care Assessment & Plan   Patient Profile: 76 y.o.femalewith past medical history of Shortness of breath, anxiety, chronic back pain,admitted on 2/27/2018with right lower extremity DVT. Patient reports that DVT pain and swelling hadgotten worse over the previous 10 days. She denied any history of trauma. Ultrasound was positive for occlusive thrombus within the popliteal vein. She underwent a thrombectomy of the right lower extremity and ultimately a right frontal ventricular ostomy, IVC placement. Patient has taken a turn for the worse overnight. CT scan performed 06-28-2016 show evidence of ventriculostomy malfunction. Patient is not localizing to pain and despite shunt replacement is showing no clinical improvement   Recommendations/Plan:  One way extubation. Prepared family that death could be eminent or over the next hours to days.  Feeding tube to be discontinued  We'll discontinue the fentanyl and start morphine continuous infusion to ensure respiratory comfort, pain management  Propofol will be stopped; will start Versed continuous infusion. Titrate for comfort  Goals of Care and  Additional Recommendations:  Limitations on Scope of Treatment: Minimize Medications, No Artificial Feeding, No Blood Transfusions, No Chemotherapy, No Diagnostics, No Hemodialysis, No Radiation, No Surgical Procedures and No Tracheostomy  Code Status:    Code Status Orders        Start     Ordered   06/28/16 1512  Do not attempt resuscitation (DNR)  Continuous    Question Answer Comment  In the event of cardiac or respiratory ARREST Do not call a "code blue"   In the event of cardiac or respiratory ARREST Do not perform Intubation, CPR, defibrillation or ACLS   In the event of cardiac or respiratory ARREST Use medication by any route, position, wound care, and other measures to relive pain and suffering. May use oxygen, suction and manual treatment of airway obstruction as needed for comfort.      06/28/16 1511    Code Status History    Date Active Date Inactive Code Status Order ID Comments User Context   06/02/2016  3:29 PM 06/28/2016  3:11 PM Full Code [id number]  Serafina Mitchell, MD Inpatient       Prognosis:   Hours - Days  Discharge Planning:  Anticipated Hospital Death  Care plan was discussed with bedside RN, Dr. Christella Noa  Thank you for allowing the Palliative Medicine Team to assist in the care of this patient.   Time In: 1400 Time Out: 1530 Total Time 90 min Prolonged Time Billed  yes       Greater than 50%  of this time was spent counseling and coordinating care related to the above assessment and plan.  Dory Horn, NP  Please contact Palliative Medicine Team phone at [phone] for questions and concerns.

## 2016-07-28 NOTE — Progress Notes (Signed)
Patient pick up by funeral home from room.  Patient was signed in and out from morgue.

## 2016-07-28 NOTE — Discharge Summary (Signed)
Physician Discharge Summary  Patient ID: Ernst SpellCarolyn Niemczyk MRN: [id number] DOB/AGE: March 08, 1941 [age] y.o.  Admit date: 06/22/2016 Discharge date: 07/07/2016  Admission Diagnosis: Left leg DVT  Discharge Diagnoses:  Left leg DVT Intracranial hemorrhage  Secondary Diagnoses: Principal Problem:   Respiratory failure ([facility]) Active Problems:   ICH (intracerebral hemorrhage) (H[facility])   DVT, popliteal, acute (H[facility])   Protein-calorie malnutrition, severe   Palliative care encounter   Atrial fibrillation with RVR ([facility])   Acquired obstructive hydrocephalus   Procedures: Thrombolysis and mechanical thrombectomy of left leg DVT  Discharged Condition: Death  Hospital Course:   The patient initially presented with pain and swelling of her left leg secondary to a DVT.  She had been to the ER for pain.  I saw her in the office and offered her medical management with anticoagulation vs an attempt at thrombolysis.  We decided to try and remove the clot given her level of disability.  She presented on the day of her procedure.  She was approximately 15 days out from her initial presentation.   During the procedure, after administration of 10 mg of catheter directed TPA to her left popliteal DVT, she became obtunded and hypoxic.  Because of her clinical presentation, my suspicion was that she had suffered a PE.  She had been on Xaralto for several days.  I elected to bolus her with 6000 units of heparin while I awaited additional TPA to be prepared from pharmacy.  Shortly after this, she became responsive.  She was following commands and now had O2 sats of 100%.  When the TPA arrived, I administered an additional 10 mg via a catheter in the RA.  Because she clinically was improving and nearly back to baseline, I elected to continue.  I also considered that her mental status change was due to the 4mg  of Versed and 100 mcg of fentanyl she had received.  She did not get Narcan because she was dramatically improved.   I then performed mechanical thrombectomy, but stopped after 81 seconds and one full pass through the popliteal, femoral, and common femoral vein as she developed bradycardia (a know issue with this procedure).  I finally proceeded with balloon venoplasty of the popliteal and femoral vein and performed a completion venogram which showed restored patency fo the venous system in her left leg.  I was very pleased with the angiographic result.  I then spoke with the patient and felt that she was doing well, although she was a little groggy.  She was neurologically intact.  We checked a ACT which was normal and then removed her popliteal sheath and held pressure for 10 minutes.  When she was transferred to the stretcher, she was conversant but slightly confused.  She said she knew she was in the operating room but said the president was Bush.  Just to be on the safe side, I sent her for a STAT head CT.  I was notified by radiology that she had a significant head bleed.  I then found Dr. Conchita ParisNundkumar in the OR and showed him the CT scan and asked him to consult.  We spoke with pharmacy to help with reversing the recent anticoagulation, which was done.  The patient was transferred to the neuro ICU.  Dr. Vassie LollAlva spoke with the family about intubation due to her progressive decline in mental status.  Once her reversal agents were administered, Dr. Conchita ParisNundkumar placed a ventriculostomy.  I spoke with the family initially after the procedure, and then 2 separate  occasions regarding what had transpired after the procedure.  I discussed the potential outcomes and what to expect overnight.  They were understanding of the gravity of the situation. Her initial repeat head CT showed some improvement.  Palliative care was consulted and helped with management.  Her ventriculostomy tube clotted, and her head CT looked worse so it was replaced.  Her clinical condition did not improve.  With assistance from Palliative care, the family elected for  terminal extubation and the patient expired.  Consults:  Treatment Team:  Lisbeth Renshaw, MD Coletta Memos, MD  Disposition: 20-Expired     Signed: Durene Cal 07/07/2016, 9:40 PM

## 2016-07-28 NOTE — Progress Notes (Signed)
Wasted 200 mL of Morphine and 30 mL of Versed.  Witnessed by Baird Lyonsasey, Charity fundraiserN.

## 2016-07-28 NOTE — Assessment & Plan Note (Signed)
Per [facility] who are advising palliation

## 2016-07-28 NOTE — Assessment & Plan Note (Signed)
Started amio gtt 06/29/16 with converstion to sinus . Currently in sinus  Plan contnue amio gtt

## 2016-07-28 NOTE — Assessment & Plan Note (Signed)
Does not meet medical wean/extubation criteria due to mental status  Plan Full vent support; terminal wean appropriate once goals established

## 2016-07-28 NOTE — Progress Notes (Signed)
Patient pronounced death by Serena ColonelYeni Pineda, RN and Val EagleMeghan Bergman, RN.  Family at bedside.  CDS was notified.  MD and Palliative care has been notified.

## 2016-07-28 NOTE — Progress Notes (Signed)
Post Mortem care completed.  Patient placement notified.  They will notify funeral home to pick up patient from room.

## 2016-07-28 NOTE — Assessment & Plan Note (Signed)
Per [facility] - advising palliation

## 2016-07-28 NOTE — Assessment & Plan Note (Signed)
Palliative care talking to family. Apparently husband resistant to terminal wean. Will await outcomes

## 2016-07-28 NOTE — Progress Notes (Signed)
Patient ID: Christina SpellCarolyn Grosso, female   DOB: 12/29/1940, 76 y.o.   MRN: [id number] BP 129/68   Pulse 62   Temp 100.2 F (37.9 C) (Axillary)   Resp 17   Ht 5\' 5"  (1.651 m)   Wt 60.4 kg (133 lb 2.5 oz)   SpO2 94%   BMI 22.16 kg/m  Not following commands Sedated Ventricular drainage is good. There has been no improvement in her neurologic exam, and I believe it is not at all likely she will improve.  Family has decided to institute comfort care measures.

## 2016-07-28 DEATH — deceased

## 2018-02-08 IMAGING — US US EXTREM LOW VENOUS*L*
1 series · 13 of 24 positions shown · non-contrast
Comparison: None.

CLINICAL DATA: Left lower extremity swelling and pain above the
foot for 2 weeks.

These results are in process of being called to the ordering
clinician or representative by the [HOSPITAL] at the
imaging location. The patient is being held for instructions.
EXAM:
LEFT LOWER EXTREMITY VENOUS DOPPLER ULTRASOUND
TECHNIQUE: Gray-scale sonography with graded compression, as well as color
Doppler and duplex ultrasound were performed to evaluate the lower
extremity deep venous systems from the level of the common femoral
vein and including the common femoral, femoral, profunda femoral,
popliteal and calf veins including the posterior tibial, peroneal
and gastrocnemius veins when visible. The superficial great
saphenous vein was also interrogated. Spectral Doppler was utilized
to evaluate flow at rest and with distal augmentation maneuvers in
the common femoral, femoral and popliteal veins.

[Series 1: us extrem low venous*left* · 0.05mm/px · 13 of 85 slices shown]
[im 1/85]
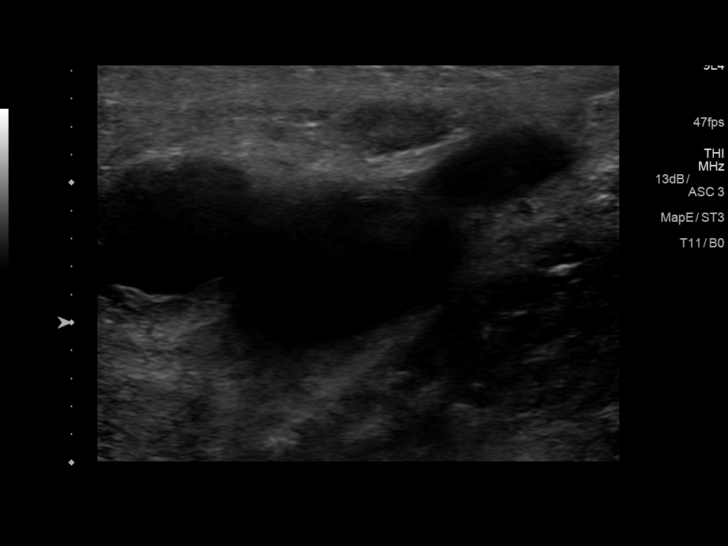
[im 8/85]
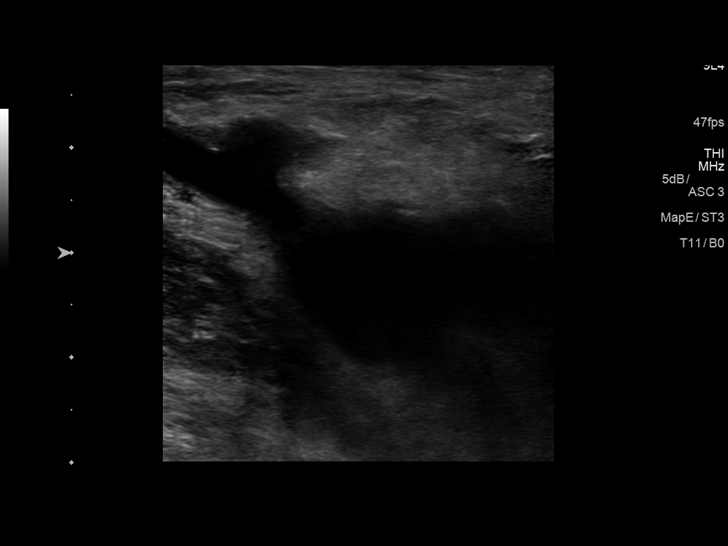
[im 15/85]
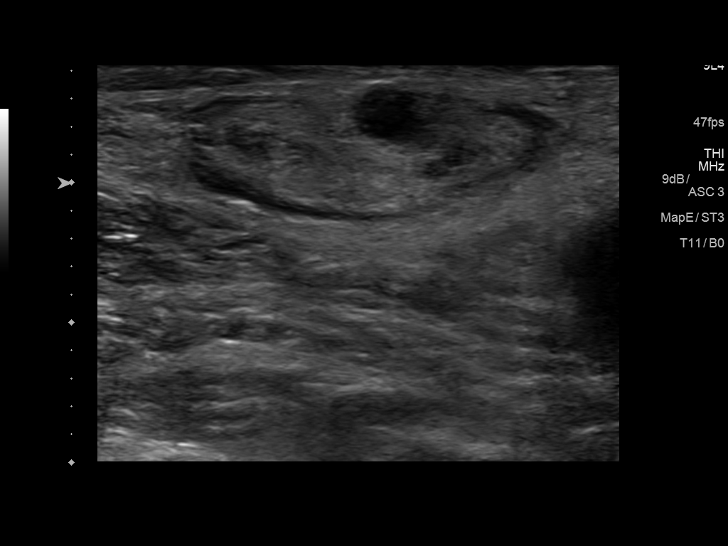
[im 22/85]
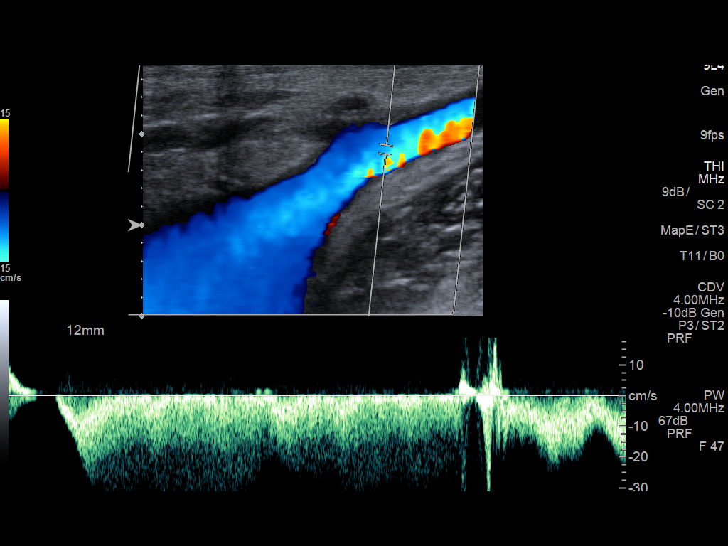
[im 30/85]
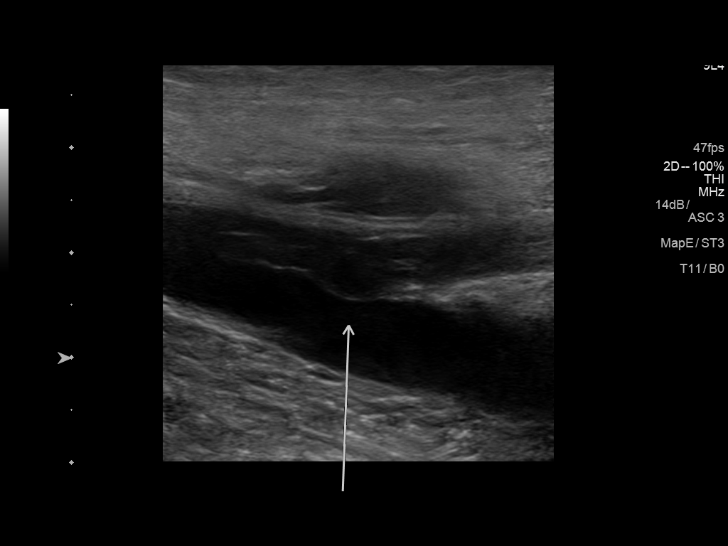
[im 37/85]
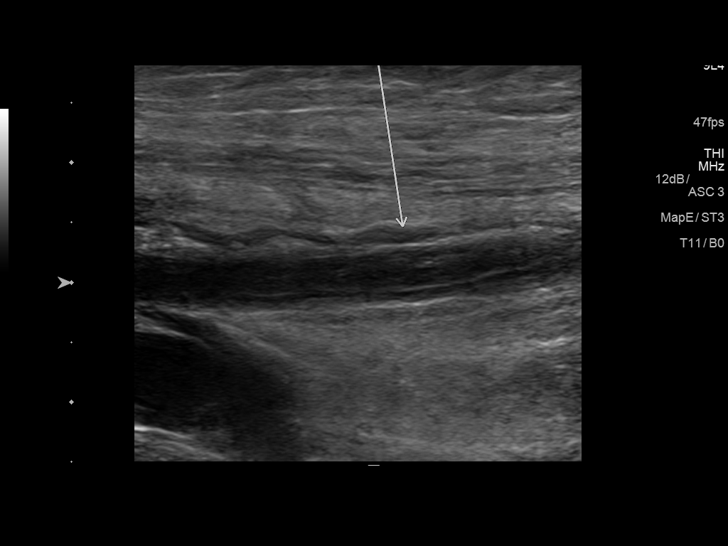
[im 44/85]
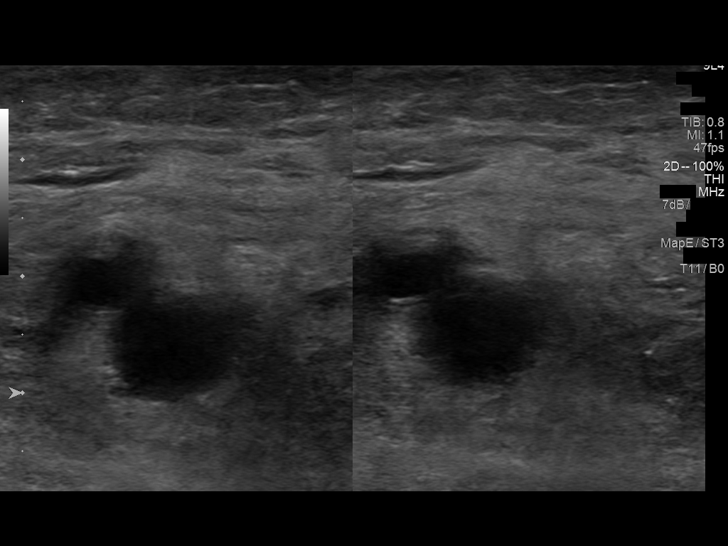
[im 48/85]
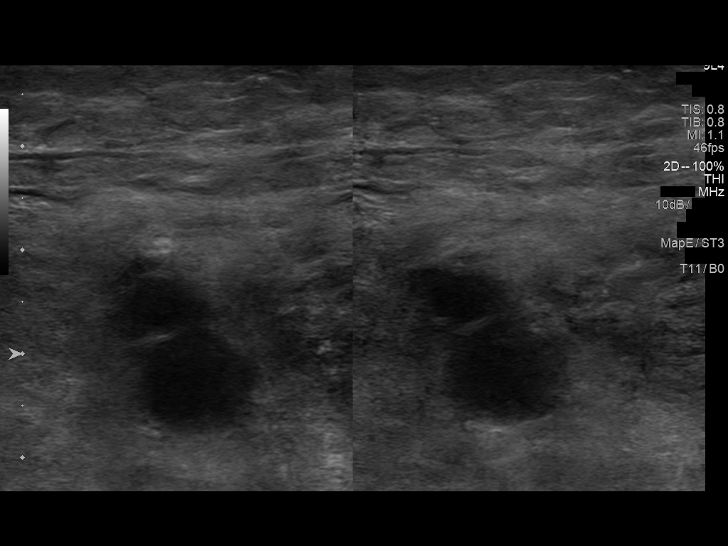
[im 55/85]
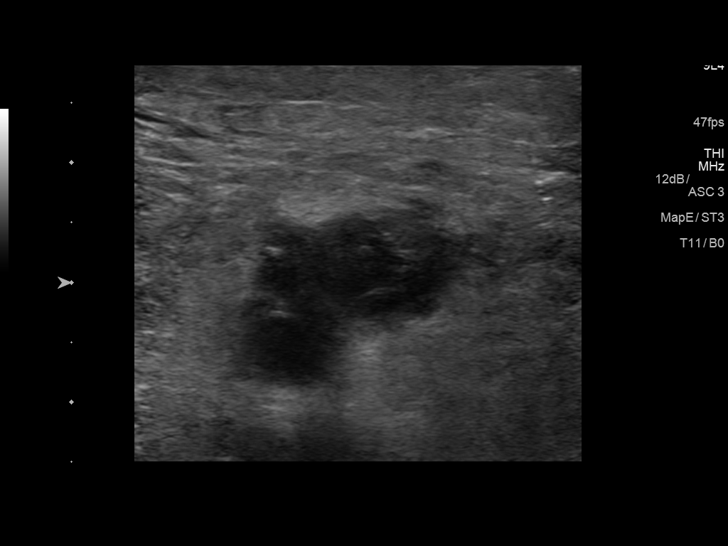
[im 63/85]
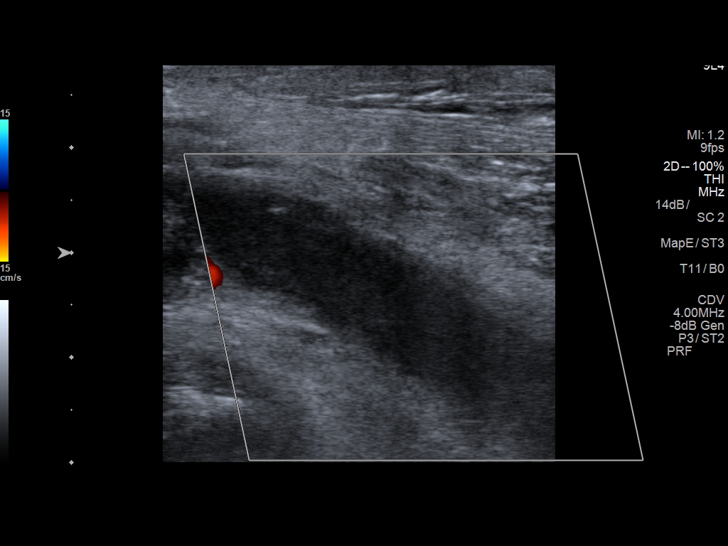
[im 70/85]
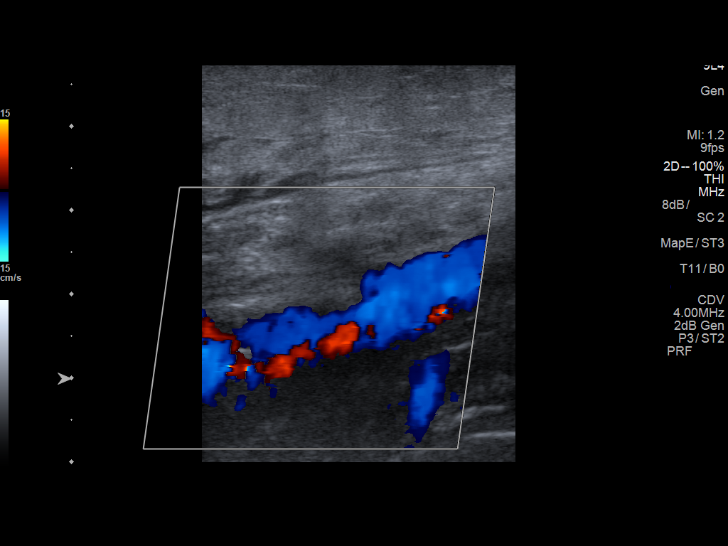
[im 77/85]
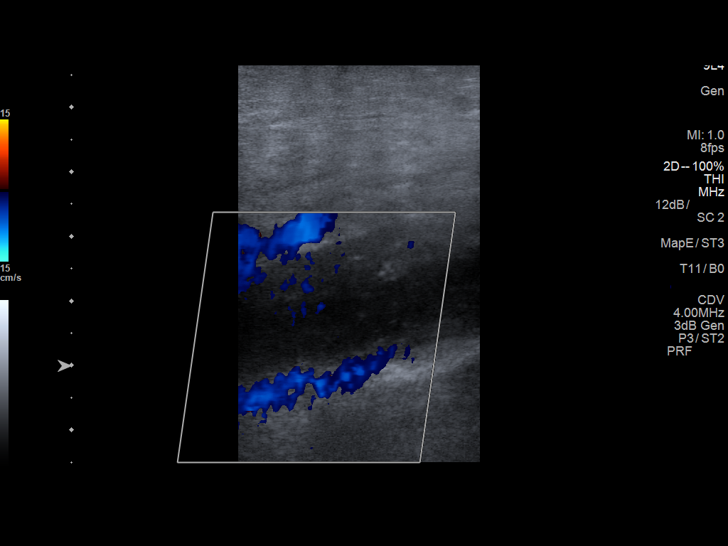
[im 85/85]
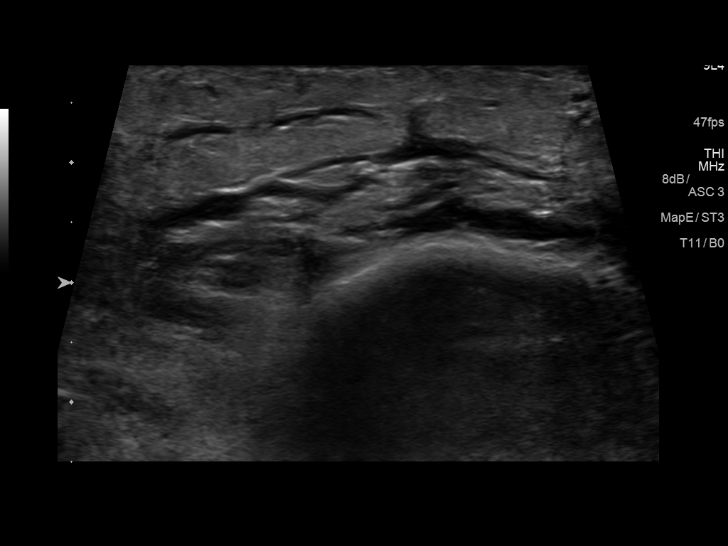

[13 of 24 positions shown; findings below may reference images not displayed]

FINDINGS: Contralateral Common Femoral Vein: Respiratory phasicity is normal
and symmetric with the symptomatic side. No evidence of thrombus.
Normal compressibility.

Common Femoral Vein: No evidence of thrombus. Normal
compressibility, respiratory phasicity and response to augmentation.

Saphenofemoral Junction: Negative for thrombus

Profunda Femoral Vein: Negative for thrombus

Femoral Vein: Diffuse occlusive thrombus with hypoechoic appearance
and mildly expanded lumen consistent with recent thrombosis.

Popliteal Vein: Occlusive thrombus

Calf Veins: Occlusive thrombus seen in the upper and mid posterior
tibial vein. The anterior tibial and peroneal veins are patent where
seen.

Superficial Great Saphenous Vein: No evidence of thrombus. Normal
compressibility and flow on color Doppler imaging.
IMPRESSION: Positive for large volume occlusive DVT in the left lower extremity.
Clot extends from the posterior tibial vein to the distal femoral
vein.
